# Patient Record
Sex: Male | Born: 1985 | Race: White | Hispanic: Yes | Marital: Married | Smoking: Never smoker
Health system: Southern US, Community
[De-identification: ages and names within clinical notes are randomized; demographics above are authoritative.]

## PROBLEM LIST (undated history)

## (undated) DIAGNOSIS — R51 Headache: Secondary | ICD-10-CM

## (undated) DIAGNOSIS — J189 Pneumonia, unspecified organism: Secondary | ICD-10-CM

## (undated) HISTORY — PX: HERNIA REPAIR: SHX51

---
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
## 2008-04-03 ENCOUNTER — Emergency Department (HOSPITAL_COMMUNITY): Admission: EM | Admit: 2008-04-03 | Discharge: 2008-04-03 | Payer: Self-pay | Admitting: Emergency Medicine

## 2008-05-28 ENCOUNTER — Emergency Department (HOSPITAL_COMMUNITY): Admission: EM | Admit: 2008-05-28 | Discharge: 2008-05-28 | Payer: Self-pay | Admitting: Emergency Medicine

## 2011-03-28 ENCOUNTER — Encounter (INDEPENDENT_AMBULATORY_CARE_PROVIDER_SITE_OTHER): Payer: Self-pay | Admitting: General Surgery

## 2011-03-28 ENCOUNTER — Ambulatory Visit (INDEPENDENT_AMBULATORY_CARE_PROVIDER_SITE_OTHER): Payer: Worker's Compensation | Admitting: General Surgery

## 2011-03-28 VITALS — BP 102/60 | HR 60 | Temp 98.6°F | Resp 16 | Ht 68.0 in | Wt 150.1 lb

## 2011-03-28 DIAGNOSIS — K429 Umbilical hernia without obstruction or gangrene: Secondary | ICD-10-CM

## 2011-03-28 NOTE — Progress Notes (Signed)
Patient ID: Travis Walker, male   DOB: 09/18/1985, 25 y.o.   MRN: [id number]  Chief Complaint  Patient presents with  . New Evaluation    eval of umbilical hernia    HPI Travis Walker is a 25 y.o. male.   HPI 25 year old Hispanic male referred by his primary care physician Dr Lauensein for evaluation of an umbilical hernia. The patient states that he started noticing pain around his umbilical area on November 25. The patient states that it bothersome primarily when he bends over or while he is at work. The discomfort lasts for several hours. He describes it as a pinch. He denies any nausea, vomiting, diarrhea, constipation, melena, hematochezia, weight loss, fever, or chills. He works as a roofer.    History reviewed. No pertinent past medical history.  History reviewed. No pertinent past surgical history.  History reviewed. No pertinent family history.  Social History History  Substance Use Topics  . Smoking status: Never Smoker   . Smokeless tobacco: Never Used  . Alcohol Use: Yes    No Known Allergies  No current outpatient prescriptions on file.    Review of Systems Review of Systems  Constitutional: Negative for fever, chills, appetite change and unexpected weight change.  HENT: Negative for congestion and trouble swallowing.   Eyes: Negative for visual disturbance.  Respiratory: Negative for chest tightness and shortness of breath.   Cardiovascular: Negative for chest pain and leg swelling.       No PND, no orthopnea, no DOE  Gastrointestinal:       See HPI  Genitourinary: Negative for dysuria and hematuria.  Musculoskeletal: Negative.   Skin: Negative for rash.  Neurological: Negative for seizures and speech difficulty.  Hematological: Does not bruise/bleed easily.  Psychiatric/Behavioral: Negative for behavioral problems and confusion.    Blood pressure 102/60, pulse 60, temperature 98.6 F (37 C), temperature source Temporal, resp. rate 16,  height 5' 8" (1.727 m), weight 150 lb 2 oz (68.096 kg).  Physical Exam Physical Exam  Constitutional: He is oriented to person, place, and time. He appears well-developed and well-nourished. No distress.  HENT:  Head: Normocephalic and atraumatic.  Eyes: Conjunctivae are normal. No scleral icterus.  Neck: Normal range of motion. Neck supple. No tracheal deviation present.  Cardiovascular: Normal rate, regular rhythm and normal heart sounds.   Pulmonary/Chest: Effort normal and breath sounds normal. No respiratory distress. He has no wheezes.  Abdominal: Soft. Bowel sounds are normal. He exhibits no distension. There is tenderness (some TTP around umbilicus). There is no rebound and no guarding. A hernia is present.       Small dime sized umbilical hernia  Musculoskeletal: Normal range of motion. He exhibits no edema and no tenderness.  Lymphadenopathy:    He has no cervical adenopathy.  Neurological: He is alert and oriented to person, place, and time.  Skin: Skin is warm and dry.  Psychiatric: He has a normal mood and affect. His behavior is normal. Thought content normal.    Data Reviewed Referring provider's note  Assessment    Umbilical hernia    Plan    We discussed the etiology of umbilical l hernias. We discussed the signs & symptoms of incarceration & strangulation.  We discussed non-operative and operative management. His hernia is fairly small so I doubt he will need mesh  The patient has elected to proceed with open repair of umbilical hernia with possible mesh    I described the procedure in detail.    The patient was given educational material. We discussed the risks and benefits including but not limited to bleeding, infection, chronic pain, nerve entrapment, hernia recurrence, possible mesh complications, hematoma formation, urinary retention, blood clots, injury to the surrounding structures, and anesthesia risk. We also discussed the typical post operative recovery  course, including no heavy lifting for 4-6 weeks. I explained that the likelihood of improvement of their symptoms is good.  Vega Stare M. Ashe Graybeal, MD, FACS General, Bariatric, & Minimally Invasive Surgery [facility]         Jatoria Kneeland M 03/28/2011, 9:36 AM    

## 2011-03-28 NOTE — Patient Instructions (Signed)
Review the pamphlet I gave you

## 2011-04-02 ENCOUNTER — Encounter (HOSPITAL_COMMUNITY): Payer: Self-pay

## 2011-04-11 ENCOUNTER — Encounter (HOSPITAL_COMMUNITY)
Admission: RE | Admit: 2011-04-11 | Discharge: 2011-04-11 | Disposition: A | Discharge status: Home / Self Care | Payer: Worker's Compensation | Source: Ambulatory Visit | Attending: General Surgery | Admitting: General Surgery

## 2011-04-11 ENCOUNTER — Encounter (HOSPITAL_COMMUNITY): Payer: Self-pay

## 2011-04-11 HISTORY — DX: Pneumonia, unspecified organism: J18.9

## 2011-04-11 HISTORY — DX: Headache: R51

## 2011-04-11 LAB — CBC
HCT: 44.3 % (ref 39.0–52.0)
MCHC: 33.9 g/dL (ref 30.0–36.0)
MCV: 90.8 fL (ref 78.0–100.0)
RDW: 13.1 % (ref 11.5–15.5)

## 2011-04-11 LAB — DIFFERENTIAL
Basophils Absolute: 0 10*3/uL (ref 0.0–0.1)
Basophils Relative: 1 % (ref 0–1)
Eosinophils Relative: 5 % (ref 0–5)
Monocytes Absolute: 0.3 10*3/uL (ref 0.1–1.0)

## 2011-04-11 NOTE — Pre-Procedure Instructions (Signed)
20 Demarian B Leverich  04/11/2011   Your procedure is scheduled on:  Thursday April 16, 2010  Report to [facility] Short Stay Center at 7:00am AM.  Call this number if you have problems the morning of surgery: [phone]   Remember:   Do not eat food:After Midnight.  May have clear liquids: up to 4 Hours before arrival. (up to 3:00am)  Clear liquids include soda, tea, black coffee, apple or grape juice, broth.  Take these medicines the morning of surgery with A SIP OF WATER: none   Do not wear jewelry, make-up or nail polish.  Do not wear lotions, powders, or perfumes. You may wear deodorant.  Do not shave 48 hours prior to surgery.  Do not bring valuables to the hospital.  Contacts, dentures or bridgework may not be worn into surgery.  Leave suitcase in the car. After surgery it may be brought to your room.  For patients admitted to the hospital, checkout time is 11:00 AM the day of discharge.   Patients discharged the day of surgery will not be allowed to drive home.  Name and phone number of your driver: April Casalino [phone]  Special Instructions: CHG Shower Use Special Wash: 1/2 bottle night before surgery and 1/2 bottle morning of surgery.   Please read over the following fact sheets that you were given: Pain Booklet, Coughing and Deep Breathing, MRSA Information and Surgical Site Infection Prevention

## 2011-04-16 MED ORDER — CEFAZOLIN SODIUM 1-5 GM-% IV SOLN
1.0000 g | INTRAVENOUS | Status: DC
  Filled 2011-04-16: qty 50

## 2011-04-17 ENCOUNTER — Ambulatory Visit (HOSPITAL_COMMUNITY): Payer: Worker's Compensation | Admitting: Anesthesiology

## 2011-04-17 ENCOUNTER — Encounter (HOSPITAL_COMMUNITY): Payer: Self-pay | Admitting: *Deleted

## 2011-04-17 ENCOUNTER — Encounter (HOSPITAL_COMMUNITY)
Admission: RE | Disposition: A | Discharge status: Home / Self Care | Payer: Self-pay | Source: Ambulatory Visit | Attending: General Surgery

## 2011-04-17 ENCOUNTER — Encounter (HOSPITAL_COMMUNITY): Payer: Self-pay | Admitting: Anesthesiology

## 2011-04-17 ENCOUNTER — Ambulatory Visit (HOSPITAL_COMMUNITY)
Admission: RE | Admit: 2011-04-17 | Discharge: 2011-04-17 | Disposition: A | Discharge status: Home / Self Care | Payer: Worker's Compensation | Source: Ambulatory Visit | Attending: General Surgery | Admitting: General Surgery

## 2011-04-17 DIAGNOSIS — Z01812 Encounter for preprocedural laboratory examination: Secondary | ICD-10-CM | POA: Insufficient documentation

## 2011-04-17 DIAGNOSIS — K429 Umbilical hernia without obstruction or gangrene: Secondary | ICD-10-CM

## 2011-04-17 HISTORY — PX: UMBILICAL HERNIA REPAIR: SHX196

## 2011-04-17 SURGERY — REPAIR, HERNIA, UMBILICAL, ADULT
Anesthesia: General | Site: Abdomen | Wound class: Clean

## 2011-04-17 MED ORDER — MEPERIDINE HCL 25 MG/ML IJ SOLN: 6.2500 mg | INTRAMUSCULAR | Status: DC | PRN

## 2011-04-17 MED ORDER — ONDANSETRON HCL 4 MG/2ML IJ SOLN
INTRAMUSCULAR | Status: DC | PRN
  Administered 2011-04-17: 4 mg via INTRAVENOUS

## 2011-04-17 MED ORDER — ONDANSETRON HCL 4 MG/2ML IJ SOLN: 4.0000 mg | Freq: Once | INTRAMUSCULAR | Status: DC | PRN

## 2011-04-17 MED ORDER — CEFAZOLIN SODIUM 1-5 GM-% IV SOLN
INTRAVENOUS | Status: DC | PRN
  Administered 2011-04-17: 1 g via INTRAVENOUS

## 2011-04-17 MED ORDER — MORPHINE SULFATE 2 MG/ML IJ SOLN: 0.0500 mg/kg | INTRAMUSCULAR | Status: DC | PRN

## 2011-04-17 MED ORDER — PROPOFOL 10 MG/ML IV EMUL
INTRAVENOUS | Status: DC | PRN
  Administered 2011-04-17: 200 mg via INTRAVENOUS
  Administered 2011-04-17: 50 mg via INTRAVENOUS

## 2011-04-17 MED ORDER — OXYCODONE-ACETAMINOPHEN 5-325 MG PO TABS: 1.0000 | ORAL_TABLET | ORAL | Status: AC | PRN

## 2011-04-17 MED ORDER — HYDROMORPHONE HCL PF 1 MG/ML IJ SOLN
0.2500 mg | INTRAMUSCULAR | Status: DC | PRN
Start: 2011-04-17 — End: 2011-04-17
  Administered 2011-04-17 (×3): 0.5 mg via INTRAVENOUS

## 2011-04-17 MED ORDER — LACTATED RINGERS IV SOLN
INTRAVENOUS | Status: DC
  Administered 2011-04-17: 09:00:00 via INTRAVENOUS

## 2011-04-17 MED ORDER — MIDAZOLAM HCL 5 MG/5ML IJ SOLN
INTRAMUSCULAR | Status: DC | PRN
  Administered 2011-04-17 (×2): 1 mg via INTRAVENOUS

## 2011-04-17 MED ORDER — LACTATED RINGERS IV SOLN
INTRAVENOUS | Status: DC | PRN
  Administered 2011-04-17 (×2): via INTRAVENOUS

## 2011-04-17 MED ORDER — 0.9 % SODIUM CHLORIDE (POUR BTL) OPTIME
TOPICAL | Status: DC | PRN
  Administered 2011-04-17: 1000 mL

## 2011-04-17 MED ORDER — HYDROMORPHONE HCL PF 1 MG/ML IJ SOLN
INTRAMUSCULAR | Status: AC
  Filled 2011-04-17: qty 1

## 2011-04-17 MED ORDER — BUPIVACAINE-EPINEPHRINE 0.25% -1:200000 IJ SOLN
INTRAMUSCULAR | Status: DC | PRN
  Administered 2011-04-17: 30 mL

## 2011-04-17 MED ORDER — SUFENTANIL CITRATE 50 MCG/ML IV SOLN
INTRAVENOUS | Status: DC | PRN
  Administered 2011-04-17: 5 ug via INTRAVENOUS
  Administered 2011-04-17: 10 ug via INTRAVENOUS
  Administered 2011-04-17: 5 ug via INTRAVENOUS

## 2011-04-17 SURGICAL SUPPLY — 50 items
BENZOIN TINCTURE PRP APPL 2/3 (GAUZE/BANDAGES/DRESSINGS) ×2 IMPLANT
BLADE SURG 10 STRL SS (BLADE) ×2 IMPLANT
BLADE SURG 15 STRL LF DISP TIS (BLADE) ×1 IMPLANT
BLADE SURG 15 STRL SS (BLADE) ×1
BLADE SURG ROTATE 9660 (MISCELLANEOUS) IMPLANT
CANISTER SUCTION 2500CC (MISCELLANEOUS) ×2 IMPLANT
CHLORAPREP W/TINT 26ML (MISCELLANEOUS) ×2 IMPLANT
CLOTH BEACON ORANGE TIMEOUT ST (SAFETY) ×2 IMPLANT
COVER SURGICAL LIGHT HANDLE (MISCELLANEOUS) ×2 IMPLANT
DECANTER SPIKE VIAL GLASS SM (MISCELLANEOUS) IMPLANT
DERMABOND ADHESIVE PROPEN (GAUZE/BANDAGES/DRESSINGS) ×1
DERMABOND ADVANCED (GAUZE/BANDAGES/DRESSINGS)
DERMABOND ADVANCED .7 DNX12 (GAUZE/BANDAGES/DRESSINGS) IMPLANT
DERMABOND ADVANCED .7 DNX6 (GAUZE/BANDAGES/DRESSINGS) ×1 IMPLANT
DRAPE LAPAROSCOPIC ABDOMINAL (DRAPES) ×2 IMPLANT
DRAPE UTILITY 15X26 W/TAPE STR (DRAPE) ×4 IMPLANT
DRSG TEGADERM 4X4.75 (GAUZE/BANDAGES/DRESSINGS) ×2 IMPLANT
ELECT CAUTERY BLADE 6.4 (BLADE) ×2 IMPLANT
ELECT REM PT RETURN 9FT ADLT (ELECTROSURGICAL) ×2
ELECTRODE REM PT RTRN 9FT ADLT (ELECTROSURGICAL) ×1 IMPLANT
GLOVE BIO SURGEON STRL SZ7.5 (GLOVE) ×2 IMPLANT
GLOVE BIOGEL PI IND STRL 7.5 (GLOVE) ×2 IMPLANT
GLOVE BIOGEL PI IND STRL 8 (GLOVE) ×1 IMPLANT
GLOVE BIOGEL PI INDICATOR 7.5 (GLOVE) ×2
GLOVE BIOGEL PI INDICATOR 8 (GLOVE) ×1
GLOVE ECLIPSE 7.5 STRL STRAW (GLOVE) ×2 IMPLANT
GLOVE SS BIOGEL STRL SZ 6.5 (GLOVE) ×1 IMPLANT
GLOVE SUPERSENSE BIOGEL SZ 6.5 (GLOVE) ×1
GOWN STRL NON-REIN LRG LVL3 (GOWN DISPOSABLE) ×2 IMPLANT
GOWN STRL REIN XL XLG (GOWN DISPOSABLE) ×2 IMPLANT
KIT BASIN OR (CUSTOM PROCEDURE TRAY) ×2 IMPLANT
KIT ROOM TURNOVER OR (KITS) ×2 IMPLANT
NEEDLE HYPO 25GX1X1/2 BEV (NEEDLE) ×2 IMPLANT
NS IRRIG 1000ML POUR BTL (IV SOLUTION) ×2 IMPLANT
PACK SURGICAL SETUP 50X90 (CUSTOM PROCEDURE TRAY) ×2 IMPLANT
PAD ARMBOARD 7.5X6 YLW CONV (MISCELLANEOUS) ×4 IMPLANT
PENCIL BUTTON HOLSTER BLD 10FT (ELECTRODE) ×2 IMPLANT
SPONGE GAUZE 4X4 12PLY (GAUZE/BANDAGES/DRESSINGS) ×2 IMPLANT
STRIP CLOSURE SKIN 1/4X4 (GAUZE/BANDAGES/DRESSINGS) ×2 IMPLANT
SUT MNCRL AB 4-0 PS2 18 (SUTURE) ×4 IMPLANT
SUT NOVA NAB DX-16 0-1 5-0 T12 (SUTURE) ×2 IMPLANT
SUT VIC AB 3-0 SH 18 (SUTURE) ×2 IMPLANT
SYR BULB 3OZ (MISCELLANEOUS) ×2 IMPLANT
SYR CONTROL 10ML LL (SYRINGE) ×2 IMPLANT
TOWEL OR 17X24 6PK STRL BLUE (TOWEL DISPOSABLE) ×2 IMPLANT
TOWEL OR 17X26 10 PK STRL BLUE (TOWEL DISPOSABLE) ×2 IMPLANT
TOWEL OR NON WOVEN STRL DISP B (DISPOSABLE) ×2 IMPLANT
TUBE CONNECTING 12X1/4 (SUCTIONS) IMPLANT
WATER STERILE IRR 1000ML POUR (IV SOLUTION) IMPLANT
YANKAUER SUCT BULB TIP NO VENT (SUCTIONS) IMPLANT

## 2011-04-17 NOTE — Preoperative (Signed)
Beta Blockers   Reason not to administer Beta Blockers:Not Applicable 

## 2011-04-17 NOTE — H&P (View-Only) (Signed)
Patient ID: Travis Walker, male   DOB: 1986/03/30, 27 y.o.   MRN: [id number]  Chief Complaint  Patient presents with  . New Evaluation    eval of umbilical hernia    HPI Travis Walker is a 26 y.o. male.   HPI 26 year old Hispanic male referred by his primary care physician Dr Deatra Robinson for evaluation of an umbilical hernia. The patient states that he started noticing pain around his umbilical area on November 25. The patient states that it bothersome primarily when he bends over or while he is at work. The discomfort lasts for several hours. He describes it as a pinch. He denies any nausea, vomiting, diarrhea, constipation, melena, hematochezia, weight loss, fever, or chills. He works as a Designer, fashion/clothing.    History reviewed. No pertinent past medical history.  History reviewed. No pertinent past surgical history.  History reviewed. No pertinent family history.  Social History History  Substance Use Topics  . Smoking status: Never Smoker   . Smokeless tobacco: Never Used  . Alcohol Use: Yes    No Known Allergies  No current outpatient prescriptions on file.    Review of Systems Review of Systems  Constitutional: Negative for fever, chills, appetite change and unexpected weight change.  HENT: Negative for congestion and trouble swallowing.   Eyes: Negative for visual disturbance.  Respiratory: Negative for chest tightness and shortness of breath.   Cardiovascular: Negative for chest pain and leg swelling.       No PND, no orthopnea, no DOE  Gastrointestinal:       See HPI  Genitourinary: Negative for dysuria and hematuria.  Musculoskeletal: Negative.   Skin: Negative for rash.  Neurological: Negative for seizures and speech difficulty.  Hematological: Does not bruise/bleed easily.  Psychiatric/Behavioral: Negative for behavioral problems and confusion.    Blood pressure 102/60, pulse 60, temperature 98.6 F (37 C), temperature source Temporal, resp. rate 16,  height 5\' 8"  (1.727 m), weight 150 lb 2 oz (68.096 kg).  Physical Exam Physical Exam  Constitutional: He is oriented to person, place, and time. He appears well-developed and well-nourished. No distress.  HENT:  Head: Normocephalic and atraumatic.  Eyes: Conjunctivae are normal. No scleral icterus.  Neck: Normal range of motion. Neck supple. No tracheal deviation present.  Cardiovascular: Normal rate, regular rhythm and normal heart sounds.   Pulmonary/Chest: Effort normal and breath sounds normal. No respiratory distress. He has no wheezes.  Abdominal: Soft. Bowel sounds are normal. He exhibits no distension. There is tenderness (some TTP around umbilicus). There is no rebound and no guarding. A hernia is present.       Small dime sized umbilical hernia  Musculoskeletal: Normal range of motion. He exhibits no edema and no tenderness.  Lymphadenopathy:    He has no cervical adenopathy.  Neurological: He is alert and oriented to person, place, and time.  Skin: Skin is warm and dry.  Psychiatric: He has a normal mood and affect. His behavior is normal. Thought content normal.    Data Reviewed Referring provider's note  Assessment    Umbilical hernia    Plan    We discussed the etiology of umbilical l hernias. We discussed the signs & symptoms of incarceration & strangulation.  We discussed non-operative and operative management. His hernia is fairly small so I doubt he will need mesh  The patient has elected to proceed with open repair of umbilical hernia with possible mesh    I described the procedure in detail.  The patient was given educational material. We discussed the risks and benefits including but not limited to bleeding, infection, chronic pain, nerve entrapment, hernia recurrence, possible mesh complications, hematoma formation, urinary retention, blood clots, injury to the surrounding structures, and anesthesia risk. We also discussed the typical post operative recovery  course, including no heavy lifting for 4-6 weeks. I explained that the likelihood of improvement of their symptoms is good.  Mary Sella. Andrey Campanile, MD, FACS General, Bariatric, & Minimally Invasive Surgery [facility], [place]         [facility] M 03/28/2011, 9:36 AM

## 2011-04-17 NOTE — Anesthesia Postprocedure Evaluation (Signed)
  Anesthesia Post-op Note  Patient: Travis Walker  Procedure(s) Performed:  HERNIA REPAIR UMBILICAL ADULT  Patient Location: PACU  Anesthesia Type: General  Level of Consciousness: awake, alert  and oriented  Airway and Oxygen Therapy: Patient Spontanous Breathing  Post-op Pain: mild  Post-op Assessment: Post-op Vital signs reviewed, Patient's Cardiovascular Status Stable, Respiratory Function Stable, Patent Airway, No signs of Nausea or vomiting and Pain level controlled  Post-op Vital Signs: Reviewed and stable  Complications: No apparent anesthesia complications

## 2011-04-17 NOTE — Anesthesia Preprocedure Evaluation (Addendum)
Anesthesia Evaluation  Patient identified by MRN, date of birth, ID band Patient awake    Reviewed: Allergy & Precautions, H&P , NPO status , Patient's Chart, lab work & pertinent test results, reviewed documented beta blocker date and time   Airway Mallampati: I TM Distance: >3 FB Neck ROM: Full    Dental No notable dental hx. (+) Dental Advisory Given   Pulmonary  clear to auscultation  Pulmonary exam normal       Cardiovascular Regular Normal    Neuro/Psych    GI/Hepatic   Endo/Other    Renal/GU      Musculoskeletal   Abdominal   Peds  Hematology   Anesthesia Other Findings   Reproductive/Obstetrics                        Anesthesia Physical Anesthesia Plan  ASA: I  Anesthesia Plan: General   Post-op Pain Management:    Induction:   Airway Management Planned: Oral ETT  Additional Equipment:   Intra-op Plan:   Post-operative Plan: Extubation in OR  Informed Consent: I have reviewed the patients History and Physical, chart, labs and discussed the procedure including the risks, benefits and alternatives for the proposed anesthesia with the patient or authorized representative who has indicated his/her understanding and acceptance.   Dental advisory given  Plan Discussed with: CRNA, Anesthesiologist and Surgeon  Anesthesia Plan Comments:         Anesthesia Quick Evaluation

## 2011-04-17 NOTE — Op Note (Signed)
Umbilical Herniorrhaphy Procedure Note  Indications: Symptomatic umbilical hernia  Pre-operative Diagnosis: umbilical hernia  Post-operative Diagnosis: umbilical hernia  Surgeon: Mary Sella. Andrey Campanile, MD, FACS   Assistants: none  Anesthesia: General endotracheal anesthesia and Local anesthesia 0.25.% bupivacaine, with epinephrine  ASA Class: 1  Procedure Details  The patient was seen in the Holding Room. The risks, benefits, complications, treatment options, and expected outcomes were discussed with the patient. The possibilities of reaction to medication, pulmonary aspiration, perforation of viscus, bleeding, recurrent infection, hernia recurrence, blood clot formation, hematoma/seroma formation, the need for additional procedures, failure to diagnose a condition, and creating a complication requiring transfusion or operation were discussed with the patient. The patient concurred with the proposed plan, giving informed consent.  The site of surgery properly noted/marked. The patient was taken to Operating Room # 16, identified as Travis Walker and the procedure verified as Umbilical Herniorrhaphy. A Time Out was held and the above information confirmed.  The patient was placed supine.  After establishing general anesthesia, the abdomen was prepped and draped in standard fashion.  Local was infiltrated infraumbilically.  A infraumbilical transverse incision was created.  Dissection was carried down to the hernia sac located above the fascia and was mobilized from surrounding structures.  Intact fascia was identified circumferentially around the defect.  The defect was about 1 cm. This was closed with 4 interrupted 0 novafil sutures.  The soft tissue was irrigated and closed in layers.  Hemostasis was confirmed.  The umbilicus was tacked backed down to the fascia with 2 interrupted 3-0 vicryl sutures. The skin incision was closed in 2 layers with a 3-0 Vicryl in the deep dermis followed by a 4-0  Monocryl subcuticular closure.  Steri-Strips, gauze, and a tegaderm were applied at the end of the operation.  Instrument, sponge, and needle counts were correct prior to closure and at the conclusion of the case.   Findings: <1cm fascial defect  Estimated Blood Loss:  Minimal         Drains: none        Specimens: none         Implants: none         Complications:  None; patient tolerated the procedure well.         Disposition: PACU - hemodynamically stable.         Condition: stable  Attending Attestation: I performed the procedure.   Mary Sella. Andrey Campanile, MD, FACS General, Bariatric, & Minimally Invasive Surgery [facility], [place]

## 2011-04-17 NOTE — Transfer of Care (Signed)
Immediate Anesthesia Transfer of Care Note  Patient: Travis Walker  Procedure(s) Performed:  HERNIA REPAIR UMBILICAL ADULT  Patient Location: PACU  Anesthesia Type: General  Level of Consciousness: awake, alert  and patient cooperative  Airway & Oxygen Therapy: Patient Spontanous Breathing and Patient connected to face mask oxygen  Post-op Assessment: Report given to PACU RN  Post vital signs: Reviewed and stable  Complications: No apparent anesthesia complications

## 2011-04-17 NOTE — Anesthesia Procedure Notes (Signed)
Procedure Name: LMA Insertion Date/Time: 04/17/2011 9:41 AM Performed by: Glendora Score Pre-anesthesia Checklist: Patient identified, Emergency Drugs available, Suction available and Patient being monitored Patient Re-evaluated:Patient Re-evaluated prior to inductionOxygen Delivery Method: Circle System Utilized Preoxygenation: Pre-oxygenation with 100% oxygen Intubation Type: IV induction LMA: LMA with gastric port inserted LMA Size: 5.0 Number of attempts: 1 Placement Confirmation: positive ETCO2 and breath sounds checked- equal and bilateral Tube secured with: Tape

## 2011-04-17 NOTE — Interval H&P Note (Signed)
History and Physical Interval Note:  04/17/2011 8:55 AM  Travis Walker  has presented today for surgery, with the diagnosis of Umbilical hernia.  The various methods of treatment have been discussed with the patient and family. After consideration of risks, benefits and other options for treatment, the patient has consented to  Procedure(s): HERNIA REPAIR UMBILICAL ADULT as a surgical intervention .  The patients' history has been reviewed, patient examined, no change in status, stable for surgery.  I have reviewed the patients' chart and labs.  Questions were answered to the patient's satisfaction.    Mary Sella. Andrey Campanile, MD, FACS General, Bariatric, & Minimally Invasive Surgery [facility], [place]   [facility] M

## 2011-04-18 ENCOUNTER — Encounter (HOSPITAL_COMMUNITY): Payer: Self-pay | Admitting: General Surgery

## 2011-04-25 ENCOUNTER — Ambulatory Visit (INDEPENDENT_AMBULATORY_CARE_PROVIDER_SITE_OTHER): Payer: Worker's Compensation | Admitting: General Surgery

## 2011-04-25 VITALS — BP 110/80 | HR 68 | Temp 97.3°F | Resp 12 | Ht 68.0 in | Wt 153.0 lb

## 2011-04-25 DIAGNOSIS — T8140XA Infection following a procedure, unspecified, initial encounter: Secondary | ICD-10-CM

## 2011-04-25 DIAGNOSIS — IMO0002 Reserved for concepts with insufficient information to code with codable children: Secondary | ICD-10-CM | POA: Insufficient documentation

## 2011-04-25 MED ORDER — CEPHALEXIN 250 MG PO CAPS: 250.0000 mg | ORAL_CAPSULE | Freq: Four times a day (QID) | ORAL | Status: DC

## 2011-04-25 NOTE — Progress Notes (Signed)
Addended byLiliana Cline on: 04/25/2011 03:59 PM   Modules accepted: Orders

## 2011-04-25 NOTE — Progress Notes (Signed)
HPI The patient comes in with foul-smelling drainage and swelling around his periumbilical incision.  PE On examination he has a puffiness around his umbilicus and in the central portion of the belly button there is some cloudy serous fluid which we drained. Approximately 20 cc were drained. This did bring some relief to the patient. A culture was taken.  Studiy review Likely cultures were sent  Assessment Likely infected periumbilical incision. It seems like it is infected seroma. Cultures have been sent.  Plan Place a patient on oral antibiotics, likely Keflex. Having come back to see me on Tuesday of next week. Keep incision clean and dry and place antibiotic ointment deep in the umbilicus.

## 2011-04-28 LAB — WOUND CULTURE

## 2011-04-29 ENCOUNTER — Ambulatory Visit (INDEPENDENT_AMBULATORY_CARE_PROVIDER_SITE_OTHER): Payer: Worker's Compensation | Admitting: General Surgery

## 2011-04-29 ENCOUNTER — Encounter (INDEPENDENT_AMBULATORY_CARE_PROVIDER_SITE_OTHER): Payer: Self-pay | Admitting: General Surgery

## 2011-04-29 ENCOUNTER — Other Ambulatory Visit (INDEPENDENT_AMBULATORY_CARE_PROVIDER_SITE_OTHER): Payer: Self-pay

## 2011-04-29 ENCOUNTER — Ambulatory Visit (HOSPITAL_COMMUNITY)
Admission: RE | Admit: 2011-04-29 | Discharge: 2011-04-29 | Disposition: A | Discharge status: Home / Self Care | Payer: Worker's Compensation | Source: Ambulatory Visit | Attending: General Surgery | Admitting: General Surgery

## 2011-04-29 DIAGNOSIS — T148XXA Other injury of unspecified body region, initial encounter: Secondary | ICD-10-CM

## 2011-04-29 DIAGNOSIS — T8149XA Infection following a procedure, other surgical site, initial encounter: Secondary | ICD-10-CM

## 2011-04-29 DIAGNOSIS — K651 Peritoneal abscess: Secondary | ICD-10-CM | POA: Insufficient documentation

## 2011-04-29 DIAGNOSIS — T8140XA Infection following a procedure, unspecified, initial encounter: Secondary | ICD-10-CM

## 2011-04-29 MED ORDER — IOHEXOL 300 MG/ML  SOLN
100.0000 mL | Freq: Once | INTRAMUSCULAR | Status: AC | PRN
  Administered 2011-04-29: 100 mL via INTRAVENOUS

## 2011-04-29 NOTE — Progress Notes (Signed)
HPI The patient comes in with continued pain and drainage from his umbilicus above the incision line from his above the umbilical hernia repair  PE On examination he has some cloudy serous fluid coming directly from the umbilicus. The incision itself is puffy however it is not draining  Studiy review Currently there are no studies to review.  Assessment Peritoneal leakage and wound infection of an umbilical hernia repair.  Plan I was in the patient on hospital for stat CT scan of the abdomen and pelvis to look at the site of his umbilical hernia repair and then have him seen by the surgical service are for consideration of drainage and treatment.

## 2011-05-05 ENCOUNTER — Encounter (HOSPITAL_COMMUNITY): Payer: Self-pay | Admitting: Pharmacy Technician

## 2011-05-05 ENCOUNTER — Encounter (INDEPENDENT_AMBULATORY_CARE_PROVIDER_SITE_OTHER): Payer: Self-pay | Admitting: General Surgery

## 2011-05-05 ENCOUNTER — Encounter (HOSPITAL_COMMUNITY)
Admission: RE | Admit: 2011-05-05 | Discharge: 2011-05-05 | Disposition: A | Discharge status: Home / Self Care | Payer: Worker's Compensation | Source: Ambulatory Visit | Attending: General Surgery | Admitting: General Surgery

## 2011-05-05 ENCOUNTER — Ambulatory Visit (INDEPENDENT_AMBULATORY_CARE_PROVIDER_SITE_OTHER): Payer: Worker's Compensation | Admitting: General Surgery

## 2011-05-05 ENCOUNTER — Encounter (HOSPITAL_COMMUNITY): Payer: Self-pay

## 2011-05-05 VITALS — BP 122/76 | HR 68 | Temp 98.2°F | Resp 16 | Ht 68.0 in | Wt 150.0 lb

## 2011-05-05 DIAGNOSIS — IMO0002 Reserved for concepts with insufficient information to code with codable children: Secondary | ICD-10-CM

## 2011-05-05 LAB — CBC
HCT: 43.4 % (ref 39.0–52.0)
Hemoglobin: 15.2 g/dL (ref 13.0–17.0)
MCH: 30.7 pg (ref 26.0–34.0)
MCHC: 35 g/dL (ref 30.0–36.0)
MCV: 87.7 fL (ref 78.0–100.0)
Platelets: 146 K/uL — ABNORMAL LOW (ref 150–400)
RBC: 4.95 MIL/uL (ref 4.22–5.81)
RDW: 12.6 % (ref 11.5–15.5)
WBC: 5.5 K/uL (ref 4.0–10.5)

## 2011-05-05 LAB — DIFFERENTIAL
Basophils Absolute: 0.1 10*3/uL (ref 0.0–0.1)
Eosinophils Relative: 3 % (ref 0–5)
Lymphocytes Relative: 36 % (ref 12–46)
Lymphs Abs: 2 10*3/uL (ref 0.7–4.0)
Neutro Abs: 2.9 10*3/uL (ref 1.7–7.7)
Neutrophils Relative %: 53 % (ref 43–77)

## 2011-05-05 LAB — SURGICAL PCR SCREEN
MRSA, PCR: NEGATIVE
Staphylococcus aureus: NEGATIVE

## 2011-05-05 NOTE — Patient Instructions (Signed)
20 Mister B Selsor  05/05/2011   Your procedure is scheduled on:  05/06/11    Tuesday  Surgery [id number]  Report to [facility] Stay Center at 1115 AM.  Call this number if you have problems the morning of surgery: [phone]   Remember:   Do not eat food:After Midnight. tonight  May have clear liquids:until Midnight .tonight  Clear liquids include soda, tea, black coffee, apple or grape juice, broth.  Take these medicines the morning of surgery with A SIP OF WATER:  Tylenol with sip water if needed  Do not wear jewelry, make-up or nail polish.  Do not wear lotions, powders, or perfumes. You may wear deodorant.  Do not shave 48 hours prior to surgery.  Do not bring valuables to the hospital.  Contacts, dentures or bridgework may not be worn into surgery.  Leave suitcase in the car. After surgery it may be brought to your room.  For patients admitted to the hospital, checkout time is 11:00 AM the day of discharge.   Patients discharged the day of surgery will not be allowed to drive home.  Name and phone number of your driver:April   wife Special Instructions: CHG Shower Use Special Wash: 1/2 bottle night before surgery and 1/2 bottle morning of surgery.   Please read over the following fact sheets that you were given: MRSA Information

## 2011-05-05 NOTE — Progress Notes (Signed)
Chief complaint: It is still draining  Procedure: Status post primary umbilical hernia repair  History of Present Ilness: 26-year-old Hispanic male comes in for another postoperative appointment. He initially saw one of my partners on January 11 complaining of pain and drainage from around his incision. He was actually draining from the base of his umbilicus and not from this infraumbilical incision. He underwent needle aspiration of the seroma and the fluid was sent for culture. He returned to the office several days later complaining of ongoing pain and drainage. He already had been placed on antibiotics. Because of the ongoing drainage, he was sent to the hospital for a CT scan of his abdomen and pelvis. It just revealed a subcutaneous fluid collection. There was no signs of intra-abdominal process. He was sent ome from the emergency department. He comes in today complaining of ongoing drainage, pain, and nausea. He denies fevers or chills. He has been having bowel movements. The drainage is clear and red tinged. It hurts more to the right side of the incision. He states that he hasn't had much appetite. He is still taking the antibiotics.  Past medical, surgical, family history, social history reviewed and unchanged except for what is mentioned above.  Review of systems-A. comprehensive 10 point review of systems was performed. All systems are negative except for what is mentioned in the history of present illness  Physical Exam: BP 122/76  Pulse 68  Temp(Src) 98.2 F (36.8 C) (Temporal)  Resp 16  Ht 5' 8" (1.727 m)  Wt 150 lb (68.04 kg)  BMI 22.81 kg/m2  Gen: alert, NAD, non-toxic appearing Pupils: equal, no scleral icterus Pulm: Lungs clear to auscultation, symmetric chest rise CV: regular rate and rhythm Abd: soft, nondistended. Well-healed infraumbilical incision. No cellulitis. No incisional hernia. There is a palpable fluid collection superior and to the right of his incision.  There is red-tinged drainage from the base of his umbilicus. He is mildly tender in this area. No induration. Ext: no edema, no calf tenderness Skin: no rash, no jaundice  DATA REVIEWED: CT abd/pelvis Findings: Within the subcutaneous tissues at and just above the  level of the umbilicus is a mixed gas and fluid collection anterior  to the abdominal wall musculature which measures approximately 1.4  x 4.4 x 3.2 cm in AP, transverse, longitudinal dimensions. There  is adjacent stranding. The anterior abdominal wall musculature is  intact with no evidence of hernia recurrence.  The lung bases are clear. The liver, gallbladder, spleen,  pancreas, adrenal glands, kidneys, urinary bladder, osseous  structures have a normal appearance. The bowel is unremarkable  with no evidence of gross obstruction or inflammation. There is no  adenopathy, free fluid, or pneumoperitoneum within the abdomen or  pelvis.  IMPRESSION:  There is an abscess within the subcutaneous fat of the midline  abdomen at and above the level of the umbilicus. The anterior  abdominal wall musculature is intact.  Micro results   Assessment and Plan: Infected postoperative infraumbilical incision seroma  We discussed several options including repeat aspiration in the office versus returning to the operating room for incision and drainage. We discussed the pros and cons of each approach. We discussed the risk and benefits of returning to the operating room such as bleeding, continued infection, injury to surrounding structures, blood clot formation, scarring, continued pain, and the need to leave the incision open to let heal by secondary intention. We discussed the typical postoperative course.  After discussion, the patient has elected   to return to the operating room for incision and drainage of this postoperative infected seroma. We will try to get him into the operating room as soon as possible. In the interim he is to  continue his antibiotics.  Ahjanae Cassel M. Mekiyah Gladwell, MD, FACS General, Bariatric, & Minimally Invasive Surgery [facility]  

## 2011-05-06 ENCOUNTER — Ambulatory Visit (HOSPITAL_COMMUNITY): Payer: Worker's Compensation | Admitting: Anesthesiology

## 2011-05-06 ENCOUNTER — Encounter (HOSPITAL_COMMUNITY): Payer: Self-pay

## 2011-05-06 ENCOUNTER — Encounter (HOSPITAL_COMMUNITY): Payer: Self-pay | Admitting: Anesthesiology

## 2011-05-06 ENCOUNTER — Observation Stay (HOSPITAL_COMMUNITY)
Admission: RE | Admit: 2011-05-06 | Discharge: 2011-05-07 | Disposition: A | Discharge status: Home / Self Care | Payer: Worker's Compensation | Source: Ambulatory Visit | Attending: General Surgery | Admitting: General Surgery

## 2011-05-06 ENCOUNTER — Encounter (HOSPITAL_COMMUNITY)
Admission: RE | Disposition: A | Discharge status: Home / Self Care | Payer: Self-pay | Source: Ambulatory Visit | Attending: General Surgery

## 2011-05-06 DIAGNOSIS — R110 Nausea: Secondary | ICD-10-CM | POA: Insufficient documentation

## 2011-05-06 DIAGNOSIS — Z8719 Personal history of other diseases of the digestive system: Secondary | ICD-10-CM

## 2011-05-06 DIAGNOSIS — Z01812 Encounter for preprocedural laboratory examination: Secondary | ICD-10-CM | POA: Insufficient documentation

## 2011-05-06 DIAGNOSIS — R1033 Periumbilical pain: Secondary | ICD-10-CM | POA: Insufficient documentation

## 2011-05-06 DIAGNOSIS — T819XXA Unspecified complication of procedure, initial encounter: Secondary | ICD-10-CM

## 2011-05-06 DIAGNOSIS — L988 Other specified disorders of the skin and subcutaneous tissue: Principal | ICD-10-CM | POA: Insufficient documentation

## 2011-05-06 DIAGNOSIS — T8189XA Other complications of procedures, not elsewhere classified, initial encounter: Secondary | ICD-10-CM | POA: Insufficient documentation

## 2011-05-06 DIAGNOSIS — Y838 Other surgical procedures as the cause of abnormal reaction of the patient, or of later complication, without mention of misadventure at the time of the procedure: Secondary | ICD-10-CM | POA: Insufficient documentation

## 2011-05-06 SURGERY — INCISION AND DRAINAGE, ABSCESS
Anesthesia: General | Wound class: Contaminated

## 2011-05-06 MED ORDER — ACETAMINOPHEN 10 MG/ML IV SOLN
INTRAVENOUS | Status: DC | PRN
  Administered 2011-05-06: 1000 mg via INTRAVENOUS

## 2011-05-06 MED ORDER — SODIUM CHLORIDE 0.9 % IR SOLN
Status: DC | PRN
  Administered 2011-05-06: 15:00:00

## 2011-05-06 MED ORDER — BUPIVACAINE-EPINEPHRINE PF 0.25-1:200000 % IJ SOLN
INTRAMUSCULAR | Status: AC
  Filled 2011-05-06: qty 30

## 2011-05-06 MED ORDER — ENOXAPARIN SODIUM 40 MG/0.4ML ~~LOC~~ SOLN
40.0000 mg | SUBCUTANEOUS | Status: DC
  Filled 2011-05-06: qty 0.4

## 2011-05-06 MED ORDER — MIDAZOLAM HCL 5 MG/5ML IJ SOLN
INTRAMUSCULAR | Status: DC | PRN
  Administered 2011-05-06: 2 mg via INTRAVENOUS

## 2011-05-06 MED ORDER — ACETAMINOPHEN 10 MG/ML IV SOLN
INTRAVENOUS | Status: AC
  Filled 2011-05-06: qty 100

## 2011-05-06 MED ORDER — BUPIVACAINE LIPOSOME 1.3 % IJ SUSP
20.0000 mL | INTRAMUSCULAR | Status: AC
  Administered 2011-05-06: 20 mL
  Filled 2011-05-06: qty 20

## 2011-05-06 MED ORDER — PANTOPRAZOLE SODIUM 40 MG IV SOLR
40.0000 mg | Freq: Every day | INTRAVENOUS | Status: DC
  Administered 2011-05-06: 40 mg via INTRAVENOUS
  Filled 2011-05-06 (×2): qty 40

## 2011-05-06 MED ORDER — FENTANYL CITRATE 0.05 MG/ML IJ SOLN
50.0000 ug | INTRAMUSCULAR | Status: DC | PRN
  Administered 2011-05-06: 100 ug via INTRAVENOUS

## 2011-05-06 MED ORDER — FENTANYL CITRATE 0.05 MG/ML IJ SOLN
INTRAMUSCULAR | Status: AC
  Filled 2011-05-06: qty 2

## 2011-05-06 MED ORDER — INFLUENZA VIRUS VACC SPLIT PF IM SUSP
0.5000 mL | INTRAMUSCULAR | Status: AC
  Administered 2011-05-07: 0.5 mL via INTRAMUSCULAR
  Filled 2011-05-06: qty 0.5

## 2011-05-06 MED ORDER — BUPIVACAINE-EPINEPHRINE 0.25% -1:200000 IJ SOLN
INTRAMUSCULAR | Status: DC | PRN
  Administered 2011-05-06: 3 mL

## 2011-05-06 MED ORDER — LACTATED RINGERS IV SOLN: INTRAVENOUS | Status: DC

## 2011-05-06 MED ORDER — CEFAZOLIN SODIUM 1-5 GM-% IV SOLN
1.0000 g | INTRAVENOUS | Status: AC
  Administered 2011-05-06: 1 g via INTRAVENOUS

## 2011-05-06 MED ORDER — KCL IN DEXTROSE-NACL 20-5-0.45 MEQ/L-%-% IV SOLN
INTRAVENOUS | Status: DC
  Administered 2011-05-06: 17:00:00 via INTRAVENOUS
  Administered 2011-05-07: 1000 mL via INTRAVENOUS
  Filled 2011-05-06 (×5): qty 1000

## 2011-05-06 MED ORDER — FENTANYL CITRATE 0.05 MG/ML IJ SOLN: 25.0000 ug | INTRAMUSCULAR | Status: DC | PRN

## 2011-05-06 MED ORDER — LACTATED RINGERS IV SOLN
INTRAVENOUS | Status: DC
  Administered 2011-05-06: 14:00:00 via INTRAVENOUS
  Administered 2011-05-06: 1000 mL via INTRAVENOUS

## 2011-05-06 MED ORDER — OXYCODONE HCL 5 MG PO TABS: 5.0000 mg | ORAL_TABLET | ORAL | Status: DC | PRN

## 2011-05-06 MED ORDER — ONDANSETRON HCL 4 MG/2ML IJ SOLN: 4.0000 mg | Freq: Four times a day (QID) | INTRAMUSCULAR | Status: DC | PRN

## 2011-05-06 MED ORDER — ACETAMINOPHEN 10 MG/ML IV SOLN
1000.0000 mg | Freq: Four times a day (QID) | INTRAVENOUS | Status: DC
  Administered 2011-05-06 – 2011-05-07 (×2): 1000 mg via INTRAVENOUS
  Filled 2011-05-06 (×4): qty 100

## 2011-05-06 MED ORDER — CEFAZOLIN SODIUM 1-5 GM-% IV SOLN
INTRAVENOUS | Status: AC
  Filled 2011-05-06: qty 50

## 2011-05-06 MED ORDER — MORPHINE SULFATE 2 MG/ML IJ SOLN
2.0000 mg | INTRAMUSCULAR | Status: DC | PRN
  Administered 2011-05-06 – 2011-05-07 (×2): 2 mg via INTRAVENOUS
  Filled 2011-05-06 (×3): qty 1

## 2011-05-06 MED ORDER — PROMETHAZINE HCL 25 MG/ML IJ SOLN: 6.2500 mg | INTRAMUSCULAR | Status: DC | PRN

## 2011-05-06 MED ORDER — FENTANYL CITRATE 0.05 MG/ML IJ SOLN
INTRAMUSCULAR | Status: DC | PRN
  Administered 2011-05-06 (×2): 25 ug via INTRAVENOUS

## 2011-05-06 SURGICAL SUPPLY — 33 items
BANDAGE GAUZE ELAST BULKY 4 IN (GAUZE/BANDAGES/DRESSINGS) IMPLANT
BLADE SURG 15 STRL LF DISP TIS (BLADE) ×1 IMPLANT
BLADE SURG 15 STRL SS (BLADE) ×1
CANISTER SUCTION 2500CC (MISCELLANEOUS) ×2 IMPLANT
CLOTH BEACON ORANGE TIMEOUT ST (SAFETY) ×2 IMPLANT
COVER SURGICAL LIGHT HANDLE (MISCELLANEOUS) ×2 IMPLANT
DECANTER SPIKE VIAL GLASS SM (MISCELLANEOUS) ×2 IMPLANT
DRAPE LAPAROSCOPIC ABDOMINAL (DRAPES) ×2 IMPLANT
DRSG PAD ABDOMINAL 8X10 ST (GAUZE/BANDAGES/DRESSINGS) IMPLANT
ELECT CAUTERY BLADE 6.4 (BLADE) ×2 IMPLANT
ELECT REM PT RETURN 9FT ADLT (ELECTROSURGICAL) ×2
ELECTRODE REM PT RTRN 9FT ADLT (ELECTROSURGICAL) ×1 IMPLANT
GLOVE BIO SURGEON STRL SZ7.5 (GLOVE) ×2 IMPLANT
GOWN STRL NON-REIN LRG LVL3 (GOWN DISPOSABLE) ×4 IMPLANT
KIT BASIN OR (CUSTOM PROCEDURE TRAY) ×2 IMPLANT
NEEDLE HYPO 25X1 1.5 SAFETY (NEEDLE) ×4 IMPLANT
NS IRRIG 1000ML POUR BTL (IV SOLUTION) ×2 IMPLANT
PENCIL BUTTON HOLSTER BLD 10FT (ELECTRODE) ×2 IMPLANT
SPONGE GAUZE 4X4 12PLY (GAUZE/BANDAGES/DRESSINGS) ×2 IMPLANT
SPONGE LAP 18X18 X RAY DECT (DISPOSABLE) ×4 IMPLANT
STAPLER VISISTAT 35W (STAPLE) ×2 IMPLANT
SUT GUT CHROMIC 3 0 (SUTURE) ×2 IMPLANT
SUT MNCRL AB 4-0 PS2 18 (SUTURE) IMPLANT
SUT VIC AB 3-0 SH 27 (SUTURE)
SUT VIC AB 3-0 SH 27XBRD (SUTURE) IMPLANT
SWAB COLLECTION DEVICE MRSA (MISCELLANEOUS) IMPLANT
SYR BULB 3OZ (MISCELLANEOUS) IMPLANT
SYR CONTROL 10ML LL (SYRINGE) ×4 IMPLANT
TAPE CLOTH SURG 4X10 WHT LF (GAUZE/BANDAGES/DRESSINGS) ×2 IMPLANT
TOWEL OR 17X26 10 PK STRL BLUE (TOWEL DISPOSABLE) ×4 IMPLANT
TUBE ANAEROBIC SPECIMEN COL (MISCELLANEOUS) IMPLANT
WATER STERILE IRR 1000ML POUR (IV SOLUTION) IMPLANT
YANKAUER SUCT BULB TIP NO VENT (SUCTIONS) ×2 IMPLANT

## 2011-05-06 NOTE — H&P (View-Only) (Signed)
Chief complaint: It is still draining  Procedure: Status post primary umbilical hernia repair  History of Present Ilness: 26 year old Hispanic male comes in for another postoperative appointment. He initially saw one of my partners on January 11 complaining of pain and drainage from around his incision. He was actually draining from the base of his umbilicus and not from this infraumbilical incision. He underwent needle aspiration of the seroma and the fluid was sent for culture. He returned to the office several days later complaining of ongoing pain and drainage. He already had been placed on antibiotics. Because of the ongoing drainage, he was sent to the hospital for a CT scan of his abdomen and pelvis. It just revealed a subcutaneous fluid collection. There was no signs of intra-abdominal process. He was sent ome from the emergency department. He comes in today complaining of ongoing drainage, pain, and nausea. He denies fevers or chills. He has been having bowel movements. The drainage is clear and red tinged. It hurts more to the right side of the incision. He states that he hasn't had much appetite. He is still taking the antibiotics.  Past medical, surgical, family history, social history reviewed and unchanged except for what is mentioned above.  Review of systems-A. comprehensive 10 point review of systems was performed. All systems are negative except for what is mentioned in the history of present illness  Physical Exam: BP 122/76  Pulse 68  Temp(Src) 98.2 F (36.8 C) (Temporal)  Resp 16  Ht 5\' 8"  (1.727 m)  Wt 150 lb (68.04 kg)  BMI 22.81 kg/m2  Gen: alert, NAD, non-toxic appearing Pupils: equal, no scleral icterus Pulm: Lungs clear to auscultation, symmetric chest rise CV: regular rate and rhythm Abd: soft, nondistended. Well-healed infraumbilical incision. No cellulitis. No incisional hernia. There is a palpable fluid collection superior and to the right of his incision.  There is red-tinged drainage from the base of his umbilicus. He is mildly tender in this area. No induration. Ext: no edema, no calf tenderness Skin: no rash, no jaundice  DATA REVIEWED: CT abd/pelvis Findings: Within the subcutaneous tissues at and just above the  level of the umbilicus is a mixed gas and fluid collection anterior  to the abdominal wall musculature which measures approximately 1.4  x 4.4 x 3.2 cm in AP, transverse, longitudinal dimensions. There  is adjacent stranding. The anterior abdominal wall musculature is  intact with no evidence of hernia recurrence.  The lung bases are clear. The liver, gallbladder, spleen,  pancreas, adrenal glands, kidneys, urinary bladder, osseous  structures have a normal appearance. The bowel is unremarkable  with no evidence of gross obstruction or inflammation. There is no  adenopathy, free fluid, or pneumoperitoneum within the abdomen or  pelvis.  IMPRESSION:  There is an abscess within the subcutaneous fat of the midline  abdomen at and above the level of the umbilicus. The anterior  abdominal wall musculature is intact.  Micro results   Assessment and Plan: Infected postoperative infraumbilical incision seroma  We discussed several options including repeat aspiration in the office versus returning to the operating room for incision and drainage. We discussed the pros and cons of each approach. We discussed the risk and benefits of returning to the operating room such as bleeding, continued infection, injury to surrounding structures, blood clot formation, scarring, continued pain, and the need to leave the incision open to let heal by secondary intention. We discussed the typical postoperative course.  After discussion, the patient has elected  to return to the operating room for incision and drainage of this postoperative infected seroma. We will try to get him into the operating room as soon as possible. In the interim he is to  continue his antibiotics.  Mary Sella. Andrey Campanile, MD, FACS General, Bariatric, & Minimally Invasive Surgery [facility], [place]

## 2011-05-06 NOTE — Op Note (Signed)
05/06/2011  Abby B Sainvil [id number]  Preoperative diagnosis: Postoperative seroma status post primary umbilical hernia repair  Postoperative diagnosis: Base of umbilical skin defect  Procedure: Exploration of prior primary umbilical hernia repair Closure of umbilical skin defect  Surgeon: Atilano Ina  Anesthesia: General and 20 cc of Exparel  EBL: Minimal  Findings: There is no skin cellulitis. There is no induration or fluctuance. The prior umbilical hernia repair was intact. There is no evidence of fascial defect. The umbilical skin at the base had a defect of about 0.5 cm.  Indications for procedure: Patient is a very pleasant 26 year old Hispanic male who underwent a recent primary umbilical hernia repair about 3 weeks ago. He presented to the office complaining of pain to the right of this incision as well as drainage from the actual umbilical area and not from the incision. There was no signs of infection at that office visit. He was reassured. He returned several days later with ongoing drainage from the base of his umbilicus as well as continued pain. A CT scan was ordered which demonstrated a subcutaneous fluid collection. There was no evidence of intra-abdominal process. I saw the patient in the clinic yesterday. He complains of ongoing drainage from the base of his umbilicus as well as continued pain. There was no evidence of superficial skin infection. But because of his ongoing discomfort and drainage from his umbilicus, we discussed several options such as observation versus needle aspiration in the office versus reexploration in the operating room. We discussed the pros and cons of each approach. The patient elected to proceed to the operating room for exploration. We discussed the risk and benefits of surgery including but not limited to bleeding, infection, injury to surrounding structures, need to leave the wound open and let it heal by secondary intention, blood clot  formation, anesthesia risk, scarring, and hernia recurrence. He elected to proceed to the operating room  Description of procedure: After obtaining informed consent, the patient was taken to the operating room 1 at [facility] and placed supine on the operating room table. General LMA anesthesia was established. Sequential compression devices were placed. He received antibiotics prior to skin incision. His abdomen was prepped and draped in the usual standard surgical fashion. A surgical timeout was performed. I infiltrated 0.25% Marcaine with epi in his old previous transverse infraumbilical incision. I then incised the skin with a #15 blade. The deep dermis was divided sharply. The previous interrupted 3-0 Vicryl sutures thru the deep dermis were removed. There was no purulence. The fascia was intact. The previous Novafil sutures were identified and inspected. They were intact. The 3-0 Vicryl sutures that had been placed to secure the base of the umbilical skin were intact; however, the umbilicus was no longer attached to the suture. The base of the umbilical skin had a 0.5 cm gap in it. The umbilical skin was reapproximated with interrupted with 4 interrupted 3-0 chromic sutures. The cavity was irrigated with antibiotic saline. Hemostasis was achieved. I injected some external in the deep dermis, subcutaneous tissue, and fascia. The skin was partially closed with 2 skin staples at either end of the transverse incision.the wound was packed with a moistened 4 x 4 gauze. A dry dressing was then applied. All needle instrument and sponge counts were correct x2. There are no immediate complications. The patient tolerated the procedure well. He was extubated and taken to the recovery room in stable condition.  Mary Sella. Andrey Campanile, MD, FACS General, Bariatric, & Minimally  Invasive Surgery [facility], [place]

## 2011-05-06 NOTE — Progress Notes (Signed)
EKG, CXR not necessary per anesthesia protocol

## 2011-05-06 NOTE — Interval H&P Note (Signed)
History and Physical Interval Note:  05/06/2011 1:11 PM  Travis Walker  has presented today for surgery, with the diagnosis of infected infraumbilical seroma  The various methods of treatment have been discussed with the patient and family. After consideration of risks, benefits and other options for treatment, the patient has consented to  Procedure(s): INCISION AND DRAINAGE INFRAUMBILICAL SEROMA as a surgical intervention .  The patients' history has been reviewed, patient examined, no change in status, stable for surgery.  I have reviewed the patients' chart and labs.  Questions were answered to the patient's satisfaction.    Mary Sella. Andrey Campanile, MD, FACS General, Bariatric, & Minimally Invasive Surgery [facility], [place]    [facility]

## 2011-05-06 NOTE — Transfer of Care (Signed)
Immediate Anesthesia Transfer of Care Note  Patient: Travis Walker  Procedure(s) Performed:  INCISION AND DRAINAGE ABSCESS - drainage of seroma and closure of umbilical skin  Patient Location: PACU  Anesthesia Type: General  Level of Consciousness: awake and alert   Airway & Oxygen Therapy: Patient Spontanous Breathing and Patient connected to face mask oxygen  Post-op Assessment: Report given to PACU RN and Post -op Vital signs reviewed and stable  Post vital signs: Reviewed and stable  Complications: No apparent anesthesia complications

## 2011-05-06 NOTE — Anesthesia Preprocedure Evaluation (Addendum)
Anesthesia Evaluation  Patient identified by MRN, date of birth, ID band Patient awake    Reviewed: Allergy & Precautions, H&P , NPO status , Patient's Chart, lab work & pertinent test results  Airway Mallampati: II TM Distance: >3 FB Neck ROM: full    Dental No notable dental hx. (+) Teeth Intact and Dental Advisory Given   Pulmonary neg pulmonary ROS, pneumonia ,  clear to auscultation  Pulmonary exam normal       Cardiovascular Exercise Tolerance: Good neg cardio ROS regular Normal    Neuro/Psych  Headaches, Negative Neurological ROS  Negative Psych ROS   GI/Hepatic negative GI ROS, Neg liver ROS,   Endo/Other  Negative Endocrine ROS  Renal/GU negative Renal ROS  Genitourinary negative   Musculoskeletal   Abdominal   Peds  Hematology negative hematology ROS (+)   Anesthesia Other Findings   Reproductive/Obstetrics negative OB ROS                          Anesthesia Physical Anesthesia Plan  ASA: I  Anesthesia Plan: General   Post-op Pain Management:    Induction: Intravenous  Airway Management Planned: LMA  Additional Equipment:   Intra-op Plan:   Post-operative Plan:   Informed Consent: I have reviewed the patients History and Physical, chart, labs and discussed the procedure including the risks, benefits and alternatives for the proposed anesthesia with the patient or authorized representative who has indicated his/her understanding and acceptance.   Dental Advisory Given  Plan Discussed with: CRNA and Surgeon  Anesthesia Plan Comments:         Anesthesia Quick Evaluation

## 2011-05-06 NOTE — Anesthesia Postprocedure Evaluation (Signed)
  Anesthesia Post-op Note  Patient: Travis Walker  Procedure(s) Performed:  INCISION AND DRAINAGE ABSCESS - drainage of seroma and closure of umbilical skin  Patient Location: PACU  Anesthesia Type: General  Level of Consciousness: awake and alert   Airway and Oxygen Therapy: Patient Spontanous Breathing  Post-op Pain: mild  Post-op Assessment: Post-op Vital signs reviewed, Patient's Cardiovascular Status Stable, Respiratory Function Stable, Patent Airway and No signs of Nausea or vomiting  Post-op Vital Signs: stable  Complications: No apparent anesthesia complications

## 2011-05-07 MED ORDER — OXYCODONE-ACETAMINOPHEN 5-325 MG PO TABS: 1.0000 | ORAL_TABLET | ORAL | Status: AC | PRN

## 2011-05-07 NOTE — Discharge Summary (Signed)
Physician Discharge Summary  Patient ID: Travis Walker MRN: [id number] DOB/AGE: 1986/02/09 26 y.o.  Admit date: 05/06/2011 Discharge date: 05/07/2011  Admission Diagnoses: Postoperative seroma s/p umbilical hernia repair  Discharge Diagnoses:  Base of umbilical skin defect  Discharged Condition: good  Hospital Course: The pt was taken to the operating room for exploration for persistent clear drainage from the base of his umbilicus, not from his incision. During surgery, it was found that the base of his umbilicus had become detached from his abdominal wall & developed a defect in the skin at the base of the umbilical skin. It was repaired. The prior incision was partially closed and packed with w-d dressing. ON pod 1, he was tolerating a diet. His vitals were stable. I had instructed and showed him and his wife on w-d dressing.   Consults: none  Significant Diagnostic Studies: none  Treatments: IV hydration, analgesia: acetaminophen, Morphine and oxycodone and surgery: Exploration of prior umbilical hernia repair and closure of umbilical skin defect  Discharge Exam: Blood pressure 91/53, pulse 63, temperature 98.1 F (36.7 C), temperature source Oral, resp. rate 12, height 5\' 8"  (1.727 m), weight 150 lb (68.04 kg), SpO2 100.00%. Alert, NAD CTA REG Soft, nd, +bs. Some serosang drainage on gauze. No cellulitis. Dressing changed.   Disposition: Home or Self Care  Discharge Orders    Future Appointments: Provider: Department: Dept Phone: Center:   05/12/2011 2:45 PM Atilano Ina, MD [facility] [phone] None     Future Orders Please Complete By Expires   Diet - low sodium heart healthy      Increase activity slowly      Lifting restrictions      Comments:   No lifting, pushing, pulling anything greater than 15 pounds for 4 weeks   Discharge wound care:      Comments:   Perform wet -dry dressing twice a day as instructed in the hospital.  Remove outer gauze and  tape.  May shower and wound can get wet then pat dry. Moisten a 2x2 gauze and pack into open incision and cover with dry gauze and tape. Perform at least once a day, preferably twice a day. May take a pain pill 30-45 minutes prior to dressing change.     Medication List  As of 05/07/2011  8:29 AM   STOP taking these medications         acetaminophen 325 MG tablet      sulfamethoxazole-trimethoprim 800-160 MG per tablet         TAKE these medications         oxyCODONE-acetaminophen 5-325 MG per tablet   Commonly known as: PERCOCET   Take 1-2 tablets by mouth every 4 (four) hours as needed for pain.           Follow-up Information    Follow up with Atilano Ina, MD in 2 weeks.   Contact information:   [employer] [address] [place] [address] [phone]         Mary Sella. Andrey Campanile, MD, FACS General, Bariatric, & Minimally Invasive Surgery [facility], [place]   Signed: Atilano Ina 05/07/2011, 8:29 AM

## 2011-05-07 NOTE — Plan of Care (Signed)
Problem: Phase I Progression Outcomes Goal: Incision/dressings dry and intact Outcome: Progressing Reinforced dsg at 1950 ob 05/06/11 . Surgical dressing marked and saturated.

## 2011-05-12 ENCOUNTER — Encounter (INDEPENDENT_AMBULATORY_CARE_PROVIDER_SITE_OTHER): Payer: Worker's Compensation | Admitting: General Surgery

## 2011-05-21 ENCOUNTER — Ambulatory Visit (INDEPENDENT_AMBULATORY_CARE_PROVIDER_SITE_OTHER): Payer: Worker's Compensation | Admitting: Surgery

## 2011-05-21 ENCOUNTER — Encounter (INDEPENDENT_AMBULATORY_CARE_PROVIDER_SITE_OTHER): Payer: Self-pay | Admitting: Surgery

## 2011-05-21 VITALS — BP 110/72 | HR 68 | Temp 97.2°F | Resp 12 | Ht 68.0 in | Wt 147.4 lb

## 2011-05-21 DIAGNOSIS — Z9889 Other specified postprocedural states: Secondary | ICD-10-CM

## 2011-05-21 NOTE — Patient Instructions (Signed)
We will see you again on an as needed basis. Please call the office at [phone] if you have any questions or concerns. Thank you for allowing us to take care of you.  

## 2011-05-21 NOTE — Progress Notes (Signed)
NAME: EBERT FORRESTER                                            DOB: 05-Jun-1985 DATE: 05/21/2011                                                  MRN: [id number]  CC: Post op   HPI: This patient comes in for post op follow-up.Heunderwent umbilical hernia repair by Dr Andrey Campanile and had a seroma drained. He is back for a wound check and staple removal. No pain. He feels that he is doing well.  PE: General: The patient appears to be healthy, NAD Wound healing, no seroma, mild edema of unbilicus  DATA REVIEWED: NO new  IMPRESSION: The patient is doing well S/P hernia repair.    PLAN: Cherlynn Polo out RTCPRN

## 2011-05-28 ENCOUNTER — Encounter (INDEPENDENT_AMBULATORY_CARE_PROVIDER_SITE_OTHER): Payer: Self-pay | Admitting: General Surgery

## 2011-05-28 ENCOUNTER — Ambulatory Visit (INDEPENDENT_AMBULATORY_CARE_PROVIDER_SITE_OTHER): Payer: Worker's Compensation | Admitting: General Surgery

## 2011-05-28 VITALS — BP 118/66 | HR 64 | Temp 97.5°F | Resp 16 | Ht 68.0 in | Wt 151.2 lb

## 2011-05-28 DIAGNOSIS — Z09 Encounter for follow-up examination after completed treatment for conditions other than malignant neoplasm: Secondary | ICD-10-CM

## 2011-05-28 NOTE — Progress Notes (Signed)
Chief complaint: Postop  Procedure: Status post open primary repair of umbilical hernia January 3; status post repair of umbilical stalk defect January 22  History of Present Ilness: 26 year old Hispanic male comes in for followup. His postoperative course was complicated by a umbilical stalk defect where he was draining a seroma through it. He comes in today complaining of intermittent drainage through his umbilicus. He denies any fevers, chills, nausea, vomiting, diarrhea, or constipation. The drainage is clear. He reports some scattered tenderness in his abdominal wall  Physical Exam: BP 118/66  Pulse 64  Temp(Src) 97.5 F (36.4 C) (Temporal)  Resp 16  Ht 5\' 8"  (1.727 m)  Wt 151 lb 3.2 oz (68.584 kg)  BMI 22.99 kg/m2  Gen: alert, NAD, non-toxic appearing Pupils: equal, no scleral icterus Abd: soft, nontender, nondistended. Well-healed infraumbilical incision. No cellulitis. No incisional hernia. Umbilicus without signs of infection Skin: no rash, no jaundice   Assessment and Plan: Status post open repair of umbilical hernia followed by repair of umbilical stalk defect.  There is no sign of infection. There is no sign of hematoma or seroma. He is cleared to return to work with light duty for 2 weeks and then he can return to full duty after that.  Followup in 2 months  Mary Sella. Andrey Campanile, MD, FACS General, Bariatric, & Minimally Invasive Surgery [facility], [place]

## 2011-05-28 NOTE — Patient Instructions (Signed)
Light duty for 2 weeks then resume full activities

## 2011-06-04 ENCOUNTER — Encounter (HOSPITAL_COMMUNITY): Payer: Self-pay | Admitting: *Deleted

## 2011-06-04 ENCOUNTER — Emergency Department (HOSPITAL_COMMUNITY)
Admission: EM | Admit: 2011-06-04 | Discharge: 2011-06-04 | Disposition: A | Discharge status: Home / Self Care | Payer: Worker's Compensation | Attending: Emergency Medicine | Admitting: Emergency Medicine

## 2011-06-04 DIAGNOSIS — Z4889 Encounter for other specified surgical aftercare: Secondary | ICD-10-CM

## 2011-06-04 DIAGNOSIS — Z9889 Other specified postprocedural states: Secondary | ICD-10-CM | POA: Insufficient documentation

## 2011-06-04 DIAGNOSIS — Z09 Encounter for follow-up examination after completed treatment for conditions other than malignant neoplasm: Secondary | ICD-10-CM | POA: Insufficient documentation

## 2011-06-04 NOTE — ED Provider Notes (Signed)
Medical screening examination/treatment/procedure(s) were performed by non-physician practitioner and as supervising physician I was immediately available for consultation/collaboration.   Kingstin Heims L Ahmere Hemenway, MD 06/04/11 2238 

## 2011-06-04 NOTE — ED Provider Notes (Signed)
History     CSN: [id number]  Arrival date & time 06/04/11  1656   First MD Initiated Contact with Patient 06/04/11 1714      Chief Complaint  Patient presents with  . Wound Check    (Consider location/radiation/quality/duration/timing/severity/associated sxs/prior treatment) HPI Comments: Pt had umbilical hernia repair on 05-06-11 by dr. Andrey Campanile in [place].  Wife states they have been cleaning daily with soap( and wter.  Seen by dr. Andrey Campanile on 05-28-11 and told it was not infected.  Wife noted small amount of "discharge" on q-tip  And it had a little blood on it today.  Pt has had no fever or chills.  He is eating and drinking well.  Patient is a 26 y.o. male presenting with wound check. The history is provided by the patient and the spouse. No language interpreter was used.  Wound Check  His temperature was unmeasured prior to arrival.    Past Medical History  Diagnosis Date  . Headache     migraines  . Pneumonia 2004    Past Surgical History  Procedure Date  . Umbilical hernia repair 04/17/2011    Procedure: HERNIA REPAIR UMBILICAL ADULT;  Surgeon: Atilano Ina, MD;  Location: [facility] OR;  Service: General;  Laterality: N/A;  . Hernia repair     No family history on file.  History  Substance Use Topics  . Smoking status: Never Smoker   . Smokeless tobacco: Never Used  . Alcohol Use: Yes     occassional      Review of Systems  Constitutional: Negative for fever and chills.  Skin: Positive for wound.  All other systems reviewed and are negative.    Allergies  Review of patient's allergies indicates no known allergies.  Home Medications  No current outpatient prescriptions on file.  BP 125/81  Pulse 83  Temp(Src) 97.6 F (36.4 C) (Oral)  Resp 16  Ht 5\' 8"  (1.727 m)  Wt 151 lb (68.493 kg)  BMI 22.96 kg/m2  SpO2 100%  Physical Exam  Nursing note and vitals reviewed. Constitutional: He is oriented to person, place, and time. He appears well-developed and  well-nourished.  HENT:  Head: Normocephalic and atraumatic.  Eyes: EOM are normal.  Neck: Normal range of motion.  Cardiovascular: Normal rate, regular rhythm, normal heart sounds and intact distal pulses.   Pulmonary/Chest: Effort normal and breath sounds normal. No respiratory distress.  Abdominal: Soft. He exhibits no distension. There is no tenderness.  Musculoskeletal: Normal range of motion.       Umbilical surgical site appears well-healed.  i cleaned out with q-tip and saw no purulent d/c and no visible wound at umbilical base.  Several flecks of dried blood noted on q-tip.  i applied bacitracin inside umbilicus.  Neurological: He is alert and oriented to person, place, and time.  Skin: Skin is warm and dry.  Psychiatric: He has a normal mood and affect. Judgment normal.    ED Course  Procedures (including critical care time)  Labs Reviewed - No data to display No results found.   No diagnosis found.    MDM          Worthy Rancher, PA 06/04/11 [phone]

## 2011-06-04 NOTE — ED Notes (Signed)
Pt had hernia repair Jan 22. Noticed discharge and small amount of blood to belly button. Surgery done by Dr. Andrey Campanile. Pt states "pinching pain to site" Symptoms for past two weeks

## 2011-06-04 NOTE — ED Notes (Signed)
Pt presents to the ED for a post-operative wound evaluation. Pt states has bloody drainage from umbilicus incision, also c/o abd pain. Spouse states returned to Dr wilson's office for a recheck on 13 th February with no new orders. Pt states he is not getting better. Fearful to return to work secondary to drainage and pain, fearful not to return for repercussion of job loss.  No drainage or odor noted.

## 2011-06-04 NOTE — Discharge Instructions (Signed)
i don't see any apparent signs of infection.  Watch umbilicus twice daily with soap and water then apply a sparing amount of antibiotic ointment.  If you note worsening pain, any redness, swelling  Or drainage of pus or blood, you should contact dr. Andrey Campanile right away.

## 2011-07-17 ENCOUNTER — Telehealth (INDEPENDENT_AMBULATORY_CARE_PROVIDER_SITE_OTHER): Payer: Self-pay | Admitting: General Surgery

## 2011-07-17 NOTE — Telephone Encounter (Signed)
Pt's case worker wants to try to come with him to next appt, to help him understand his medical issue and treatment.  His language barrier and non-medical background has him calling the case worker and she cannot help him much.  She would be able to come to an appt on 07/23/11 if anything opens up with Dr. Andrey Campanile that day.  (His appt is on 07/25/11, but she has a conflict.)  Please call her if he can move into a cancellation:  [phone].

## 2011-07-18 NOTE — Telephone Encounter (Signed)
Spoke with case Production designer, theatre/television/film. Appt moved to [phone]. Case manager will contact patient with new appt.

## 2011-07-18 NOTE — Telephone Encounter (Signed)
Called back. Dr Andrey Campanile has created an office on 07/29/11 and will offer appt then. Awaiting call.

## 2011-07-25 ENCOUNTER — Encounter (INDEPENDENT_AMBULATORY_CARE_PROVIDER_SITE_OTHER): Payer: Worker's Compensation | Admitting: General Surgery

## 2011-07-29 ENCOUNTER — Ambulatory Visit (INDEPENDENT_AMBULATORY_CARE_PROVIDER_SITE_OTHER): Payer: Worker's Compensation | Admitting: General Surgery

## 2011-07-29 ENCOUNTER — Encounter (INDEPENDENT_AMBULATORY_CARE_PROVIDER_SITE_OTHER): Payer: Self-pay | Admitting: General Surgery

## 2011-07-29 VITALS — BP 92/58 | HR 68 | Temp 98.0°F | Resp 16 | Ht 68.0 in | Wt 154.2 lb

## 2011-07-29 DIAGNOSIS — Z09 Encounter for follow-up examination after completed treatment for conditions other than malignant neoplasm: Secondary | ICD-10-CM

## 2011-07-29 NOTE — Patient Instructions (Signed)
Can take motrin or tylenol for the intermittent discomfort

## 2011-07-29 NOTE — Progress Notes (Signed)
Subjective:     Patient ID: Travis Walker, male   DOB: Mar 30, 1986, 26 y.o.   MRN: [id number]  HPI 25yo HM comes in for follow-up. He underwent open umbilical hernia without mesh on 04/17/11; however, he developed drainage from his umbilical stalk and was taken back to the OR for washout and partial closure on 1/22. I last saw him on 2/13. He denies any fevers. He reports a good appetite. He denies any diarrhea or constipation or drainage from his incision. He states he will have a "pinching" sensation after working for several days. It is intermittent and generally occurs after working for several hours. It doesn't radiate. He rates it as a 5/10. He also relays he will have some occasional itching in that area.   Review of Systems     Objective:   Physical Exam BP 92/58  Pulse 68  Temp(Src) 98 F (36.7 C) (Temporal)  Resp 16  Ht 5\' 8"  (1.727 m)  Wt 154 lb 3.2 oz (69.945 kg)  BMI 23.45 kg/m2 Alert, nad cta  reg Soft, nt, nd. Well healed infraumbilical incision. No hernia recurrence. No cellulitis. No fluctuance. No hematoma/seroma/bulge/induration    Assessment:     S/p open primary umbilical hernia repair; s/p evacuation of seroma and partial skin closure    Plan:     Overall, doing well. i'm pleased with how the incision looks. There is no sign of infection or hernia recurrence. I reassured him and his wife that this pinching should improve with time. I recommended that he could take tylenol or motrin when this occurs. He can return to full duty. The hernia repair is as strong as it will be at this point. 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 F/u prn  Mary Sella. Andrey Campanile, MD, FACS General, Bariatric, & Minimally Invasive Surgery [facility], [place]

## 2011-08-18 ENCOUNTER — Telehealth (INDEPENDENT_AMBULATORY_CARE_PROVIDER_SITE_OTHER): Payer: Self-pay | Admitting: General Surgery

## 2011-08-18 NOTE — Telephone Encounter (Signed)
Message copied by Liliana Cline on Mon Aug 18, 2011 10:12 AM ------      Message from: Andrey Campanile, ERIC M      Created: Fri Aug 15, 2011  7:31 PM       pls schedule pt for a f/u appt with me. Apparently he his still having issues. I got a letter from his worker's Occupational hygienist.             Thanks,      wilson

## 2011-08-18 NOTE — Telephone Encounter (Signed)
Appt made for follow up on 09/10/11 @ 9:00 am. Spoke with patient's wife.

## 2011-09-10 ENCOUNTER — Encounter (INDEPENDENT_AMBULATORY_CARE_PROVIDER_SITE_OTHER): Payer: Worker's Compensation | Admitting: General Surgery

## 2011-11-13 ENCOUNTER — Encounter (INDEPENDENT_AMBULATORY_CARE_PROVIDER_SITE_OTHER): Payer: Worker's Compensation | Admitting: General Surgery

## 2011-11-17 ENCOUNTER — Encounter (INDEPENDENT_AMBULATORY_CARE_PROVIDER_SITE_OTHER): Payer: Self-pay | Admitting: General Surgery

## 2013-07-07 ENCOUNTER — Emergency Department (HOSPITAL_COMMUNITY)
Admission: EM | Admit: 2013-07-07 | Discharge: 2013-07-07 | Disposition: A | Discharge status: Home / Self Care | Payer: Worker's Compensation | Attending: Emergency Medicine | Admitting: Emergency Medicine

## 2013-07-07 ENCOUNTER — Encounter (HOSPITAL_COMMUNITY): Payer: Self-pay | Admitting: Emergency Medicine

## 2013-07-07 DIAGNOSIS — B9789 Other viral agents as the cause of diseases classified elsewhere: Secondary | ICD-10-CM | POA: Insufficient documentation

## 2013-07-07 DIAGNOSIS — B349 Viral infection, unspecified: Secondary | ICD-10-CM

## 2013-07-07 DIAGNOSIS — Z8679 Personal history of other diseases of the circulatory system: Secondary | ICD-10-CM | POA: Insufficient documentation

## 2013-07-07 DIAGNOSIS — D696 Thrombocytopenia, unspecified: Secondary | ICD-10-CM

## 2013-07-07 DIAGNOSIS — Z8701 Personal history of pneumonia (recurrent): Secondary | ICD-10-CM | POA: Insufficient documentation

## 2013-07-07 LAB — BASIC METABOLIC PANEL
BUN: 16 mg/dL (ref 6–23)
CALCIUM: 8.8 mg/dL (ref 8.4–10.5)
CO2: 25 meq/L (ref 19–32)
Chloride: 105 mEq/L (ref 96–112)
Creatinine, Ser: 0.84 mg/dL (ref 0.50–1.35)
GFR calc Af Amer: >90 mL/min (ref >90)
GFR calc non Af Amer: >90 mL/min (ref >90)
GLUCOSE: 104 mg/dL — AB (ref 70–99)
Potassium: 3.9 mEq/L (ref 3.7–5.3)
SODIUM: 140 meq/L (ref 137–147)

## 2013-07-07 LAB — CBC WITH DIFFERENTIAL/PLATELET
Basophils Absolute: 0 10*3/uL (ref 0.0–0.1)
Basophils Relative: 1 % (ref 0–1)
EOS PCT: 1 % (ref 0–5)
Eosinophils Absolute: 0 10*3/uL (ref 0.0–0.7)
HEMATOCRIT: 47.3 % (ref 39.0–52.0)
Hemoglobin: 16.2 g/dL (ref 13.0–17.0)
LYMPHS ABS: 0.4 10*3/uL — AB (ref 0.7–4.0)
LYMPHS PCT: 24 % (ref 12–46)
MCH: 30.6 pg (ref 26.0–34.0)
MCHC: 34.2 g/dL (ref 30.0–36.0)
MCV: 89.4 fL (ref 78.0–100.0)
MONO ABS: 0.1 10*3/uL (ref 0.1–1.0)
Monocytes Relative: 7 % (ref 3–12)
Neutro Abs: 1.3 10*3/uL — ABNORMAL LOW (ref 1.7–7.7)
Neutrophils Relative %: 68 % (ref 43–77)
Platelets: 50 10*3/uL — ABNORMAL LOW (ref 150–400)
RBC: 5.29 MIL/uL (ref 4.22–5.81)
RDW: 13 % (ref 11.5–15.5)
WBC: 1.9 10*3/uL — AB (ref 4.0–10.5)

## 2013-07-07 LAB — URINE MICROSCOPIC-ADD ON

## 2013-07-07 LAB — URINALYSIS, ROUTINE W REFLEX MICROSCOPIC
GLUCOSE, UA: NEGATIVE mg/dL
HGB URINE DIPSTICK: NEGATIVE
Ketones, ur: 15 mg/dL — AB
Leukocytes, UA: NEGATIVE
Nitrite: NEGATIVE
PH: 6 (ref 5.0–8.0)
PROTEIN: 30 mg/dL — AB
Specific Gravity, Urine: >1.03 — ABNORMAL HIGH (ref 1.005–1.030)
Urobilinogen, UA: 0.2 mg/dL (ref 0.0–1.0)

## 2013-07-07 LAB — RAPID STREP SCREEN (MED CTR MEBANE ONLY): STREPTOCOCCUS, GROUP A SCREEN (DIRECT): NEGATIVE

## 2013-07-07 MED ORDER — IBUPROFEN 800 MG PO TABS
800.0000 mg | ORAL_TABLET | Freq: Once | ORAL | Status: AC
  Administered 2013-07-07: 800 mg via ORAL
  Filled 2013-07-07: qty 1

## 2013-07-07 MED ORDER — FAMOTIDINE 20 MG PO TABS: 20.0000 mg | ORAL_TABLET | Freq: Two times a day (BID) | ORAL | Status: DC

## 2013-07-07 MED ORDER — PROMETHAZINE HCL 25 MG PO TABS: 25.0000 mg | ORAL_TABLET | Freq: Four times a day (QID) | ORAL | Status: DC | PRN

## 2013-07-07 MED ORDER — PANTOPRAZOLE SODIUM 40 MG PO TBEC
40.0000 mg | DELAYED_RELEASE_TABLET | Freq: Once | ORAL | Status: AC
  Administered 2013-07-07: 40 mg via ORAL
  Filled 2013-07-07: qty 1

## 2013-07-07 MED ORDER — ONDANSETRON 8 MG PO TBDP
8.0000 mg | ORAL_TABLET | Freq: Once | ORAL | Status: AC
  Administered 2013-07-07: 8 mg via ORAL
  Filled 2013-07-07: qty 1

## 2013-07-07 NOTE — ED Provider Notes (Signed)
CSN: [id number]     Arrival date & time 07/07/13  1018 History   First MD Initiated Contact with Patient 07/07/13 1035     Chief Complaint  Patient presents with  . Fever     (Consider location/radiation/quality/duration/timing/severity/associated sxs/prior Treatment) HPI Comments: Travis Walker is a 28 y.o. male who presents to the Emergency Department complaining of fever, chills that began approximately 24 hours ago. Patient reports onset was sudden. He also complains of generalized body aches, frontal headache, generalized malaise, and nausea vomiting. States that he vomited one time last evening. He describes the headache as a throbbing sensation to the front of his head. He denies dysuria, cough or chest congestion, abdominal pain, neck pain or stiffness or runny nose.  He states that he took Tylenol this morning without relief. Nothing makes the symptoms better or worse.  The history is provided by the patient.    Past Medical History  Diagnosis Date  . Headache(784.0)     migraines  . Pneumonia 2004   Past Surgical History  Procedure Laterality Date  . Umbilical hernia repair  04/17/2011    Procedure: HERNIA REPAIR UMBILICAL ADULT;  Surgeon: Atilano InaEric M Wilson, MD;  [facility];  Service: General;  Laterality: N/A;  . Hernia repair     History reviewed. No pertinent family history. History  Substance Use Topics  . Smoking status: Never Smoker   . Smokeless tobacco: Never Used  . Alcohol Use: No     Comment: occassional    Review of Systems  Constitutional: Positive for fever, chills and fatigue. Negative for appetite change.  HENT: Negative for congestion, rhinorrhea, sneezing and sore throat.   Respiratory: Negative for chest tightness and shortness of breath.   Cardiovascular: Negative for chest pain.  Gastrointestinal: Positive for nausea and vomiting. Negative for abdominal pain, diarrhea, blood in stool and abdominal distention.  Genitourinary: Negative for  dysuria, frequency, flank pain, decreased urine volume and difficulty urinating.  Musculoskeletal: Positive for myalgias. Negative for back pain, neck pain and neck stiffness.  Skin: Negative for color change and rash.  Neurological: Positive for headaches. Negative for dizziness, syncope, speech difficulty, weakness, light-headedness and numbness.  Hematological: Negative for adenopathy.  All other systems reviewed and are negative.      Allergies  Review of patient's allergies indicates no known allergies.  Home Medications  No current outpatient prescriptions on file. BP 115/70  Pulse 79  Temp(Src) 98.1 F (36.7 C) (Oral)  Resp 18  Ht 5\' 8"  (1.727 m)  Wt 160 lb (72.576 kg)  BMI 24.33 kg/m2  SpO2 97% Physical Exam  Nursing note and vitals reviewed. Constitutional: He is oriented to person, place, and time. He appears well-developed and well-nourished. No distress.  HENT:  Head: Normocephalic and atraumatic.  Mouth/Throat: Uvula is midline and mucous membranes are normal. Posterior oropharyngeal erythema present. No oropharyngeal exudate, posterior oropharyngeal edema or tonsillar abscesses.  Eyes: Conjunctivae and EOM are normal. Pupils are equal, round, and reactive to light.  Neck: Normal range of motion, full passive range of motion without pain and phonation normal. Neck supple. No spinous process tenderness and no muscular tenderness present. No rigidity. No Brudzinski's sign and no Kernig's sign noted. No thyromegaly present.  Cardiovascular: Normal rate, regular rhythm, normal heart sounds and intact distal pulses.   No murmur heard. Pulmonary/Chest: Effort normal and breath sounds normal. No respiratory distress. He has no wheezes. He has no rales. He exhibits no tenderness.  Abdominal: Soft. He exhibits  no distension and no mass. There is no tenderness. There is no rebound and no guarding.  Musculoskeletal: Normal range of motion.  Lymphadenopathy:    He has no  cervical adenopathy.  Neurological: He is alert and oriented to person, place, and time. He has normal strength. No cranial nerve deficit or sensory deficit. He exhibits normal muscle tone. Coordination and gait normal. GCS eye subscore is 4. GCS verbal subscore is 5. GCS motor subscore is 6.  Reflex Scores:      Tricep reflexes are 2+ on the right side and 2+ on the left side.      Bicep reflexes are 2+ on the right side and 2+ on the left side. Skin: Skin is warm and dry.  Psychiatric: He has a normal mood and affect.    ED Course  Procedures (including critical care time) Labs Review Labs Reviewed  CBC WITH DIFFERENTIAL - Abnormal; Notable for the following:    WBC 1.9 (*)    Platelets 50 (*)    Neutro Abs 1.3 (*)    Lymphs Abs 0.4 (*)    All other components within normal limits  BASIC METABOLIC PANEL - Abnormal; Notable for the following:    Glucose, Bld 104 (*)    All other components within normal limits  URINALYSIS, ROUTINE W REFLEX MICROSCOPIC - Abnormal; Notable for the following:    Specific Gravity, Urine >1.030 (*)    Bilirubin Urine SMALL (*)    Ketones, ur 15 (*)    Protein, ur 30 (*)    All other components within normal limits  RAPID STREP SCREEN  CULTURE, GROUP A STREP  URINE MICROSCOPIC-ADD ON   Imaging Review No results found.   EKG Interpretation None      MDM   Final diagnoses:  Viral illness  Thrombocytopenia    Patient is non-toxic appearing.  Mucous membranes moist.  No meningeal signs.  Sudden onset sx's that appears c/w viral illness.  Will check labs,  Patient is feeling better and drank fluids w/o difficulty. Vital improved.  Pt has leukocytosis and recurrent  Thrombocytopenia.  No hx of recent tick bite.    Labs and pt hx discussed with Dr. Estell Harpin.   Patient appears stable for discharge and agrees to close f/u with his PMD to have labs rechecked.  Also advised him to return if sx's not improving.  Pt verbalized understanding and agrees  to plan     Ichael Pullara L. Trisha Mangle, PA-C 07/08/13 2052

## 2013-07-07 NOTE — Discharge Instructions (Signed)
Infecciones virales  (Viral Infections)  Un virus es un tipo de germen. Puede causar:   Dolor de garganta leve.  Dolores musculares.  Dolor de Turkmenistancabeza.  Secrecin nasal.  Erupciones.  Lagrimeo.  Cansancio.  Tos.  Prdida del apetito.  Ganas de vomitar (nuseas).  Vmitos.  Materia fecal lquida (diarrea). CUIDADOS EN EL HOGAR   Tome la medicacin slo como le haya indicado el mdico.  Beba gran cantidad de lquido para mantener la orina de tono claro o color amarillo plido. Las bebidas deportivas son Nadara Modeuna buena eleccin.  Descanse lo suficiente y Abbott Laboratoriesalimntese bien. Puede tomar sopas y caldos con crackers o arroz. SOLICITE AYUDA DE INMEDIATO SI:   Siente un dolor de cabeza muy intenso.  Le falta el aire.  Tiene dolor en el pecho o en el cuello.  Tiene una erupcin que no tena antes.  No puede detener los vmitos.  Tiene una hemorragia que no se detiene.  No puede retener los lquidos.  Usted o el nio tienen una temperatura oral le sube a ms de 38,9 C (102 F), y no puede bajarla con medicamentos.  Su beb tiene ms de 3 meses y su temperatura rectal es de 102 F (38.9 C) o ms.  Su beb tiene 3 meses o menos y su temperatura rectal es de 100.4 F (38 C) o ms. ASEGRESE DE QUE:   Comprende estas instrucciones.  Controlar la enfermedad.  Solicitar ayuda de inmediato si no mejora o si empeora. Document Released: 09/02/2010 Document Revised: 06/23/2011 [facility] Patient Information 2014 [place], [place].  Trombocitopenia  (Thrombocytopenia)  Trombocitopenia significa que la sangre no contiene la cantidad suficiente de plaquetas. Las plaquetas son pequeas clulas que estn en la sangre. Cuando hay un sangrado, las plaquetas se agrupan alrededor del corte o la lesin para Therapist, musicdetener el sangrado. Esto se conoce como coagulacin de Risk managerla sangre. Cuando no se tiene la cantidad suficiente de plaquetas puede haber problemas hemorrgicos. CUIDADOS EN EL HOGAR    Controle su piel y el interior de su boca para ver si hay hematomas o sangre, segn las indicaciones de su mdico.  Controle cuando salive (esputo), haga pis (orine) y vaya de cuerpo (heces) para ver si hay sangre, segn le indique su mdico.  No realice actividades que puedan causar bultos o hematomas hasta que su mdico lo autorice.  Tenga cuidado de no cortarse cuando use tijeras, agujas, cuchillos u otros utensilios.  Tenga cuidado de no quemarse al planchar o al cocinar.  Consulte con su mdico si puede beber alcohol.  Tome slo los medicamentos que le haya indicado el mdico.  Informe a sus mdicos y al dentista que tiene este problema hemorrgico. SOLICITE AYUDA DE INMEDIATO SI:   Tiene hemorragias en algn lugar del cuerpo.  Tiene sangrado o hematomas sin saber porqu.  Observa sangre cuando escupe, orina o va de cuerpo. ASEGRESE DE QUE:   Comprende estas instrucciones.  Controlar su enfermedad.  Solicitar ayuda de inmediato si no mejora o si empeora. Document Released: 03/20/2011 Document Revised: 06/23/2011 [facility] Patient Information 2014 [place]Care, [place].

## 2013-07-07 NOTE — ED Notes (Signed)
Pt reports feeling febrile and having chillssince yesterday morning. Denies n/v/d, coughing/sneezing, sick contacts.

## 2013-07-09 LAB — CULTURE, GROUP A STREP

## 2013-07-13 NOTE — ED Provider Notes (Signed)
Medical screening examination/treatment/procedure(s) were performed by non-physician practitioner and as supervising physician I was immediately available for consultation/collaboration.   EKG Interpretation None        Aamilah Augenstein L Earle Troiano, MD 07/13/13 1508 

## 2019-11-20 ENCOUNTER — Emergency Department (HOSPITAL_COMMUNITY)
Admission: EM | Admit: 2019-11-20 | Discharge: 2019-11-20 | Disposition: A | Discharge status: Home / Self Care | Payer: PRIVATE HEALTH INSURANCE | Attending: Emergency Medicine | Admitting: Emergency Medicine

## 2019-11-20 ENCOUNTER — Emergency Department (HOSPITAL_COMMUNITY): Payer: PRIVATE HEALTH INSURANCE

## 2019-11-20 ENCOUNTER — Encounter (HOSPITAL_COMMUNITY): Payer: Self-pay | Admitting: Emergency Medicine

## 2019-11-20 ENCOUNTER — Other Ambulatory Visit: Payer: Self-pay

## 2019-11-20 DIAGNOSIS — S0101XA Laceration without foreign body of scalp, initial encounter: Secondary | ICD-10-CM | POA: Diagnosis not present

## 2019-11-20 DIAGNOSIS — Y9289 Other specified places as the place of occurrence of the external cause: Secondary | ICD-10-CM | POA: Diagnosis not present

## 2019-11-20 DIAGNOSIS — Y998 Other external cause status: Secondary | ICD-10-CM | POA: Insufficient documentation

## 2019-11-20 DIAGNOSIS — W11XXXA Fall on and from ladder, initial encounter: Secondary | ICD-10-CM | POA: Insufficient documentation

## 2019-11-20 DIAGNOSIS — S0990XA Unspecified injury of head, initial encounter: Secondary | ICD-10-CM | POA: Insufficient documentation

## 2019-11-20 DIAGNOSIS — S8012XA Contusion of left lower leg, initial encounter: Secondary | ICD-10-CM | POA: Diagnosis not present

## 2019-11-20 DIAGNOSIS — Y9389 Activity, other specified: Secondary | ICD-10-CM | POA: Diagnosis not present

## 2019-11-20 MED ORDER — IBUPROFEN 400 MG PO TABS
600.0000 mg | ORAL_TABLET | Freq: Once | ORAL | Status: AC
  Administered 2019-11-20: 600 mg via ORAL
  Filled 2019-11-20: qty 2

## 2019-11-20 MED ORDER — LIDOCAINE-EPINEPHRINE (PF) 2 %-1:200000 IJ SOLN
5.0000 mL | Freq: Once | INTRAMUSCULAR | Status: AC
  Administered 2019-11-20: 5 mL
  Filled 2019-11-20: qty 10

## 2019-11-20 NOTE — Discharge Instructions (Addendum)
Wound care: Keep your wounds clean. In 24 hours, you can get your scalp wound wet; gently clean it with soap and water in the shower and then pat it dry. You can take ibuprofen/advil every 6hours as needed for pain. Apply ice for 20 minutes at a time to help with pain and swelling. Follow up with your primary care or urgent care for wound recheck in 5-7 days.  Return to the ER for fever, pus draining from wound, redness, or new or worsening symptoms.  Head Injury: Stay hydrated and get plenty of rest. Limit your screen time and complex thinking. Avoid any contact sports/activities and ladder climbing to prevent re-injury to your head. Follow up with your primary care provider in 1 week for re-check and to be cleared to return to normal activity. Return to the ER if you develop severely worsening headache, changes in your vision, persistent vomiting, or new or concerning symptoms.

## 2019-11-20 NOTE — ED Triage Notes (Addendum)
Pt reports was climbing on a ladder to finish a roof and reports ladder started sliding and pt reports fell with ladder and hit head on post of ladder. Pt reports ladder fell from approximately 6-11ft. Pt denies loc/being on blood thinners. Laceration with bleeding controlled noted to posterior head and left anterior shin. Bruise/swelling noted to LLE.   Gauze and telfa applied to head laceration and secured with conform wrapping in case site started to re-bleed.

## 2019-11-20 NOTE — ED Notes (Signed)
PA in to assess 

## 2019-11-20 NOTE — ED Provider Notes (Signed)
[facility] EMERGENCY DEPARTMENT Provider Note   CSN: [id number] Arrival date & time: 11/20/19  7048     History Chief Complaint  Patient presents with  . Fall    Travis Walker is a 34 y.o. male presenting to the ED with complaint of fall off a ladder that occurred prior to arrival. Pt states he was standing about 6-79ft high on a ladder. The foot of the ladder slid out and down and the ladder fell flat. He fell along with the ladder and towards his right side, striking his right occipital scalp on the ladder with associated laceration. No LOC. He also bumped his left shin causing small laceration. He reports pain to the scalp wound, denies N/V, vision changes, neck pain, or other injuries. Not on anticoagulation.   The history is provided by the patient.       Past Medical History:  Diagnosis Date  . Headache(784.0)    migraines  . Pneumonia 2004    There are no problems to display for this patient.   Past Surgical History:  Procedure Laterality Date  . HERNIA REPAIR    . UMBILICAL HERNIA REPAIR  04/17/2011   Procedure: HERNIA REPAIR UMBILICAL ADULT;  Surgeon: Atilano Ina, MD;  Location: [facility] OR;  Service: General;  Laterality: N/A;       History reviewed. No pertinent family history.  Social History   Tobacco Use  . Smoking status: Never Smoker  . Smokeless tobacco: Never Used  Substance Use Topics  . Alcohol use: No    Comment: occassional  . Drug use: No    Home Medications Prior to Admission medications   Medication Sig Start Date End Date Taking? Authorizing Provider  acetaminophen (TYLENOL) 500 MG tablet Take 1,000 mg by mouth every 6 (six) hours as needed for fever.    [provider]  Chlorphen-Phenyleph-ASA (ALKA-SELTZER PLUS COLD PO) Take 2 capsules by mouth every 6 (six) hours as needed (cold/cough).    [provider]  famotidine (PEPCID) 20 MG tablet Take 1 tablet (20 mg total) by mouth 2 (two) times daily. 07/07/13    Triplett, Tammy, PA-C  Phenyleph-Diphenhyd-DM-APAP (THERAFLU SEVERE COLD & COUGH) PACKET MISC Take 1 Package by mouth at bedtime as needed (cold and cough).    [provider]  promethazine (PHENERGAN) 25 MG tablet Take 1 tablet (25 mg total) by mouth every 6 (six) hours as needed for nausea or vomiting. 07/07/13   Triplett, Tammy, PA-C    Allergies    Patient has no known allergies.  Review of Systems   Review of Systems  Eyes: Negative for photophobia and visual disturbance.  Gastrointestinal: Negative for vomiting.  Musculoskeletal: Negative for neck pain.  Skin: Positive for wound.  Neurological: Positive for headaches. Negative for syncope.  Hematological: Does not bruise/bleed easily.  All other systems reviewed and are negative.   Physical Exam Updated Vital Signs BP 131/88 (BP Location: Right Arm)   Pulse 69   Temp 98.2 F (36.8 C) (Oral)   Resp 16   Ht 5\' 7"  (1.702 m)   Wt 72.6 kg   SpO2 99%   BMI 25.07 kg/m   Physical Exam Vitals and nursing note reviewed.  Constitutional:      Appearance: He is well-developed.  HENT:     Head: Normocephalic.     Comments: 2cm linear laceration to right occipital scalp with associated hematoma and TTP. No deformity. Wound is not grossly contaminated Eyes:     Extraocular  Movements: Extraocular movements intact.     Conjunctiva/sclera: Conjunctivae normal.     Pupils: Pupils are equal, round, and reactive to light.  Cardiovascular:     Rate and Rhythm: Normal rate and regular rhythm.  Pulmonary:     Effort: Pulmonary effort is normal.     Breath sounds: Normal breath sounds.  Abdominal:     Palpations: Abdomen is soft.  Musculoskeletal:     Cervical back: Normal range of motion and neck supple. No tenderness.     Comments: Left anterior mid shin with hematoma and small <1cm wound.   Skin:    General: Skin is warm.  Neurological:     Mental Status: He is alert.     Comments: CN grossly intact, PERRL, EOM  nl Normal strength and sensation to all 4 extremities. Normal tone, normal coordination with finger-to-nose  Psychiatric:        Behavior: Behavior normal.     ED Results / Procedures / Treatments   Labs (all labs ordered are listed, but only abnormal results are displayed) Labs Reviewed - No data to display  EKG None  Radiology DG Cervical Spine Complete  Result Date: 11/20/2019 CLINICAL DATA:  Fall off ladder 6-8 feet. EXAM: CERVICAL SPINE - COMPLETE 4+ VIEW COMPARISON:  None. FINDINGS: There is no evidence of cervical spine fracture or prevertebral soft tissue swelling. Alignment is normal. No other significant bone abnormalities are identified. IMPRESSION: Negative cervical spine radiographs. Electronically Signed   By: Norva Pavlov M.D.   On: 11/20/2019 10:38   DG Tibia/Fibula Left  Result Date: 11/20/2019 CLINICAL DATA:  Fall. Laceration. Fell off ladder 6-8 feet. Laceration with bleeding. EXAM: LEFT TIBIA AND FIBULA - 2 VIEW COMPARISON:  None. FINDINGS: There is no evidence of fracture or other focal bone lesions. Soft tissues are unremarkable. IMPRESSION: Negative. Electronically Signed   By: Norva Pavlov M.D.   On: 11/20/2019 10:35   CT Head Wo Contrast  Result Date: 11/20/2019 CLINICAL DATA:  Fall with right head injury on ladder today. EXAM: CT HEAD WITHOUT CONTRAST TECHNIQUE: Contiguous axial images were obtained from the base of the skull through the vertex without intravenous contrast. COMPARISON:  None. FINDINGS: Brain: No evidence of parenchymal hemorrhage or extra-axial fluid collection. No mass lesion, mass effect, or midline shift. No CT evidence of acute infarction. Cerebral volume is age appropriate. No ventriculomegaly. Vascular: No acute abnormality. Skull: No evidence of calvarial fracture. Sinuses/Orbits: The visualized paranasal sinuses are essentially clear. Other:  The mastoid air cells are unopacified. IMPRESSION: Negative head CT. No evidence of acute  intracranial abnormality. No evidence of calvarial fracture. Electronically Signed   By: Delbert Phenix M.D.   On: 11/20/2019 10:35    Procedures .Marland KitchenLaceration Repair  Date/Time: 11/20/2019 4:08 PM Performed by: Koty Anctil, [place] N, PA-C Authorized by: Nacole Fluhr, [place] N, PA-C   Consent:    Consent obtained:  Verbal   Consent given by:  Patient   Risks discussed:  Pain and infection Anesthesia (see MAR for exact dosages):    Anesthesia method:  Local infiltration   Local anesthetic:  Lidocaine 2% WITH epi Laceration details:    Location:  Scalp   Scalp location:  Occipital   Length (cm):  2 Repair type:    Repair type:  Simple Pre-procedure details:    Preparation:  Patient was prepped and draped in usual sterile fashion Exploration:    Hemostasis achieved with:  Direct pressure   Wound exploration: entire depth of wound probed and  visualized     Wound extent: no foreign bodies/material noted     Contaminated: no   Treatment:    Area cleansed with:  Saline   Amount of cleaning:  Standard   Visualized foreign bodies/material removed: no   Skin repair:    Repair method:  Staples   Number of staples:  4 Approximation:    Approximation:  Close Post-procedure details:    Dressing:  Open (no dressing)   Patient tolerance of procedure:  Tolerated well, no immediate complications   (including critical care time)  Medications Ordered in ED Medications  ibuprofen (ADVIL) tablet 600 mg (600 mg Oral Given 11/20/19 1201)  lidocaine-EPINEPHrine (XYLOCAINE W/EPI) 2 %-1:200000 (PF) injection 5 mL (5 mLs Infiltration Given 11/20/19 1200)    ED Course  I have reviewed the triage vital signs and the nursing notes.  Pertinent labs & imaging results that were available during my care of the patient were reviewed by me and considered in my medical decision making (see chart for details).    MDM Rules/Calculators/A&P                          Patient presenting with laceration to occipital  scalp after fall off of a ladder that occurred prior to arrival.  Patient states he was standing about 6 to 8 feet up on a ladder when the ladder slipped and slid down backwards.  The ladder landed under him however he fell towards his right side and struck the right occipital scalp on the ladder.  No LOC.  He has some mild headache localized to the wound, no vision changes, nausea, vomiting, neck pain or other neuro symptoms.  He also has contusion and small wound to the left shin that does not require repair.  No focal neuro deficits.  CT head is negative.  Plain films also ordered in triage are negative.  Tdap was updated last year per patient.  Wounds are irrigated, scalp wound closed with staples.  Discussed concussion precautions and wound care.  Discussed follow-up and return precautions.  Patient is well-appearing, agreeable to plan and appropriate for discharge.  Final Clinical Impression(s) / ED Diagnoses Final diagnoses:  Laceration of scalp, initial encounter  Contusion of left lower extremity, initial encounter  Minor head injury without loss of consciousness, initial encounter    Rx / DC Orders ED Discharge Orders    None       Brendon Christoffel, [place] N, PA-C 11/20/19 1610    Bethann Berkshire, MD 11/22/19 1026

## 2019-11-26 ENCOUNTER — Emergency Department (HOSPITAL_COMMUNITY)
Admission: EM | Admit: 2019-11-26 | Discharge: 2019-11-26 | Disposition: A | Discharge status: Home / Self Care | Payer: No Typology Code available for payment source | Attending: Emergency Medicine | Admitting: Emergency Medicine

## 2019-11-26 ENCOUNTER — Other Ambulatory Visit: Payer: Self-pay

## 2019-11-26 ENCOUNTER — Encounter (HOSPITAL_COMMUNITY): Payer: Self-pay | Admitting: Emergency Medicine

## 2019-11-26 DIAGNOSIS — S0101XD Laceration without foreign body of scalp, subsequent encounter: Secondary | ICD-10-CM | POA: Diagnosis not present

## 2019-11-26 DIAGNOSIS — Y939 Activity, unspecified: Secondary | ICD-10-CM | POA: Diagnosis not present

## 2019-11-26 DIAGNOSIS — Y999 Unspecified external cause status: Secondary | ICD-10-CM | POA: Insufficient documentation

## 2019-11-26 DIAGNOSIS — Z79899 Other long term (current) drug therapy: Secondary | ICD-10-CM | POA: Diagnosis not present

## 2019-11-26 DIAGNOSIS — Z4802 Encounter for removal of sutures: Secondary | ICD-10-CM

## 2019-11-26 DIAGNOSIS — X58XXXD Exposure to other specified factors, subsequent encounter: Secondary | ICD-10-CM | POA: Insufficient documentation

## 2019-11-26 DIAGNOSIS — Y929 Unspecified place or not applicable: Secondary | ICD-10-CM | POA: Insufficient documentation

## 2019-11-26 DIAGNOSIS — S0990XD Unspecified injury of head, subsequent encounter: Secondary | ICD-10-CM | POA: Diagnosis present

## 2019-11-26 NOTE — Discharge Instructions (Addendum)
Continue to clean your wound with mild soap and water.  Your wound appears to be healing well.  Follow-up with your primary doctor for recheck, return to emergency department for any worsening symptoms.

## 2019-11-26 NOTE — ED Triage Notes (Signed)
Pt need staples removed from head

## 2019-11-26 NOTE — ED Provider Notes (Signed)
[facility] EMERGENCY DEPARTMENT Provider Note   CSN: [id number] Arrival date & time: 11/26/19  1315     History Chief Complaint  Patient presents with  . Suture / Staple Removal    Travis Walker is a 34 y.o. male.  HPI      Travis Walker is a 34 y.o. male who presents to the Emergency Department requesting staple removal.  He was seen here on 11/20/2019 after a laceration to his right posterior scalp.  4 staples were applied.  He denies complications, pain, swelling or drainage.  No fever or chills.   Past Medical History:  Diagnosis Date  . Headache(784.0)    migraines  . Pneumonia 2004    There are no problems to display for this patient.   Past Surgical History:  Procedure Laterality Date  . HERNIA REPAIR    . UMBILICAL HERNIA REPAIR  04/17/2011   Procedure: HERNIA REPAIR UMBILICAL ADULT;  Surgeon: Atilano Ina, MD;  Location: [facility] OR;  Service: General;  Laterality: N/A;       History reviewed. No pertinent family history.  Social History   Tobacco Use  . Smoking status: Never Smoker  . Smokeless tobacco: Never Used  Substance Use Topics  . Alcohol use: No    Comment: occassional  . Drug use: No    Home Medications Prior to Admission medications   Medication Sig Start Date End Date Taking? Authorizing Provider  acetaminophen (TYLENOL) 500 MG tablet Take 1,000 mg by mouth every 6 (six) hours as needed for fever.    [provider]  Chlorphen-Phenyleph-ASA (ALKA-SELTZER PLUS COLD PO) Take 2 capsules by mouth every 6 (six) hours as needed (cold/cough).    [provider]  famotidine (PEPCID) 20 MG tablet Take 1 tablet (20 mg total) by mouth 2 (two) times daily. 07/07/13   Tahlia Deamer, PA-C  Phenyleph-Diphenhyd-DM-APAP (THERAFLU SEVERE COLD & COUGH) PACKET MISC Take 1 Package by mouth at bedtime as needed (cold and cough).    [provider]  promethazine (PHENERGAN) 25 MG tablet Take 1 tablet (25 mg total) by  mouth every 6 (six) hours as needed for nausea or vomiting. 07/07/13   Paschal Blanton, PA-C    Allergies    Patient has no known allergies.  Review of Systems   Review of Systems  Constitutional: Negative for chills and fever.  Musculoskeletal: Negative for arthralgias, back pain, joint swelling and neck pain.  Skin: Positive for wound. Negative for color change.       Laceration to right posterior scalp with 4 staples present.  Neurological: Negative for dizziness, weakness, numbness and headaches.  Hematological: Does not bruise/bleed easily.    Physical Exam Updated Vital Signs Pulse 76   Temp 98.2 F (36.8 C) (Oral)   Resp 17   Ht 5\' 7"  (1.702 m)   Wt 72.6 kg   SpO2 98%   BMI 25.06 kg/m   Physical Exam Vitals and nursing note reviewed.  Constitutional:      Appearance: Normal appearance. He is not ill-appearing.  HENT:     Head:     Comments: Laceration to right posterior scalp.  4 staples present.  No erythema, drainage, or edema.  Laceration appears to be healing well. Cardiovascular:     Rate and Rhythm: Normal rate and regular rhythm.  Pulmonary:     Effort: Pulmonary effort is normal.     Breath sounds: Normal breath sounds.  Chest:     Chest wall: No  tenderness.  Abdominal:     Palpations: Abdomen is soft.     Tenderness: There is no abdominal tenderness.  Musculoskeletal:     Cervical back: Normal range of motion. No tenderness.  Skin:    General: Skin is warm.  Neurological:     Mental Status: He is alert.     ED Results / Procedures / Treatments   Labs (all labs ordered are listed, but only abnormal results are displayed) Labs Reviewed - No data to display  EKG None  Radiology No results found.  Procedures Procedures (including critical care time)  SUTURE REMOVAL Performed by: Johneisha Broaden  Consent: Verbal consent obtained. Patient identity confirmed: provided demographic data Time out: Immediately prior to procedure a "time out"  was called to verify the correct patient, procedure, equipment, support staff and site/side marked as required.  Location details: right posterior scalp  Wound Appearance: clean  Sutures/Staples Removed: 4  Facility: sutures placed in this facility Patient tolerance: Patient tolerated the procedure well with no immediate complications.  Staples removed by me without complication.  No wound dehiscence   Medications Ordered in ED Medications - No data to display  ED Course  I have reviewed the triage vital signs and the nursing notes.  Pertinent labs & imaging results that were available during my care of the patient were reviewed by me and considered in my medical decision making (see chart for details).    MDM Rules/Calculators/A&P                          Patient here requesting staple removal.  Wound appears to be healing well.  Patient agrees to continue wound care instructions as discussed.  He appears appropriate for discharge home.   Final Clinical Impression(s) / ED Diagnoses Final diagnoses:  Encounter for staple removal    Rx / DC Orders ED Discharge Orders    None       Pauline Aus, PA-C 11/26/19 1623    Derwood Kaplan, MD 11/26/19 1952

## 2021-01-13 IMAGING — CT CT HEAD W/O CM
3 series · 16 of 47 positions shown, 19 images · non-contrast
Comparison: None.

CLINICAL DATA: Fall with right head injury on ladder today.

EXAM:
CT HEAD WITHOUT CONTRAST
TECHNIQUE: Contiguous axial images were obtained from the base of the skull
through the vertex without intravenous contrast.

[Series 2: head w o · axial · 0.40mm/px · z∈[-61,+84]mm · 10 of 35 slices shown, 13 images]
[im 3/35  brain]
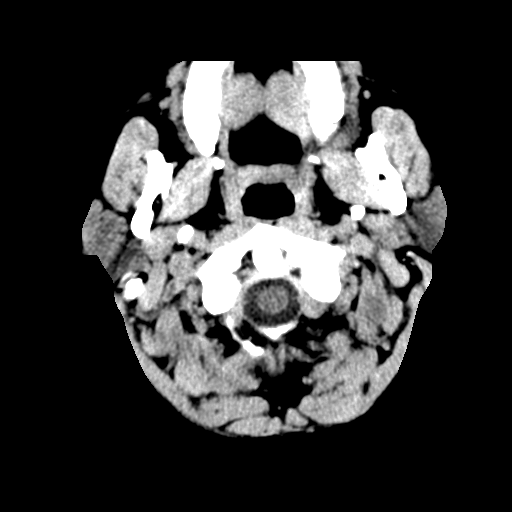
[im 3/35  bone]
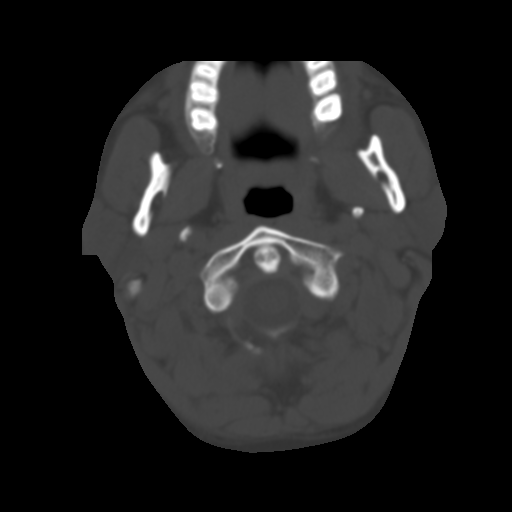
[im 6/35  brain]
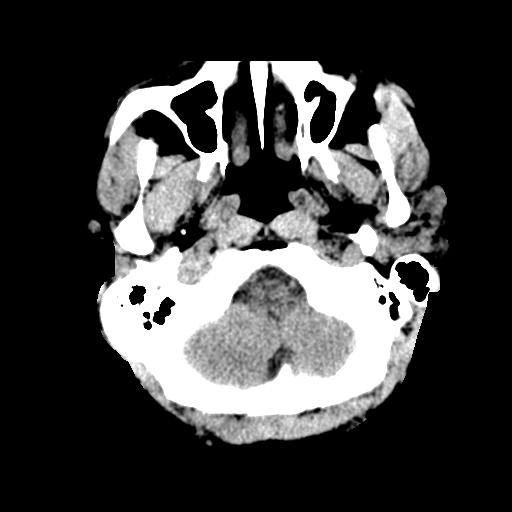
[im 10/35  brain]
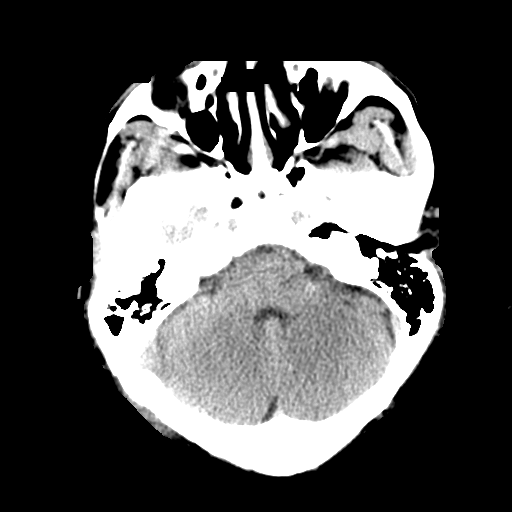
[im 12/35  brain]
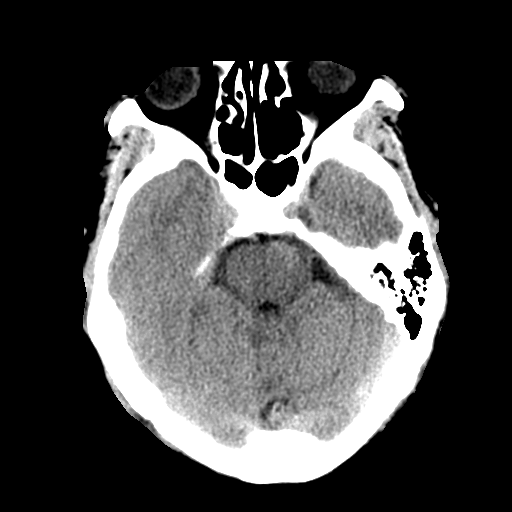
[im 16/35  brain]
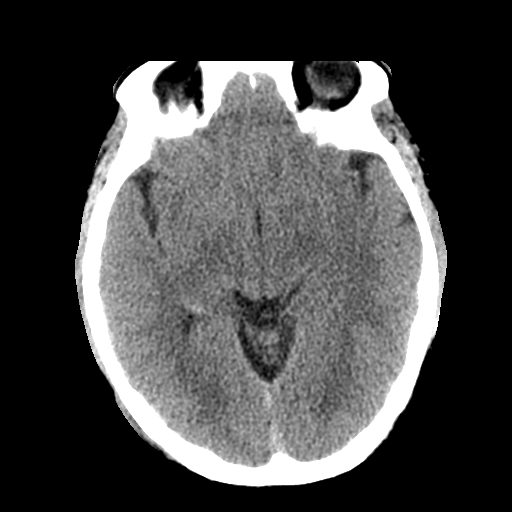
[im 16/35  bone]
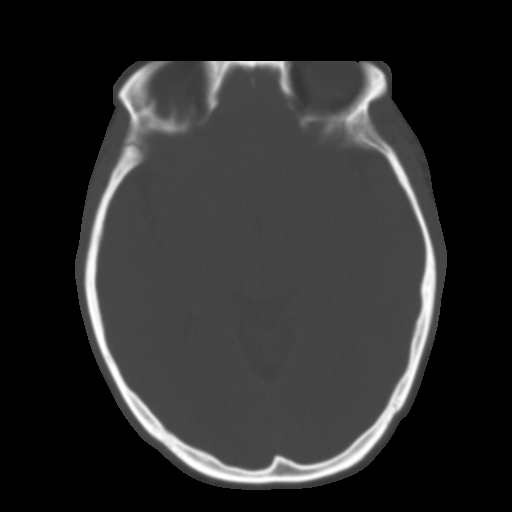
[im 19/35  brain]
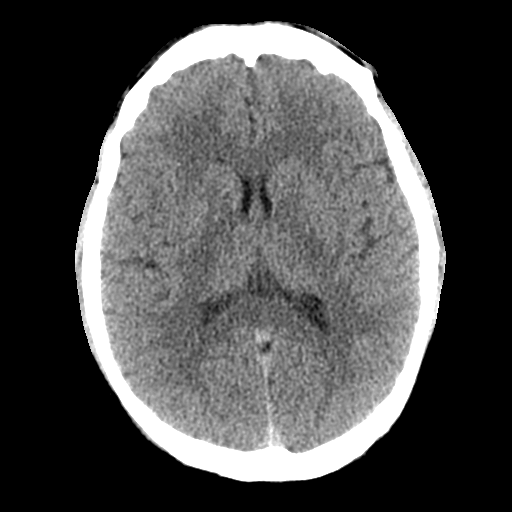
[im 23/35  brain]
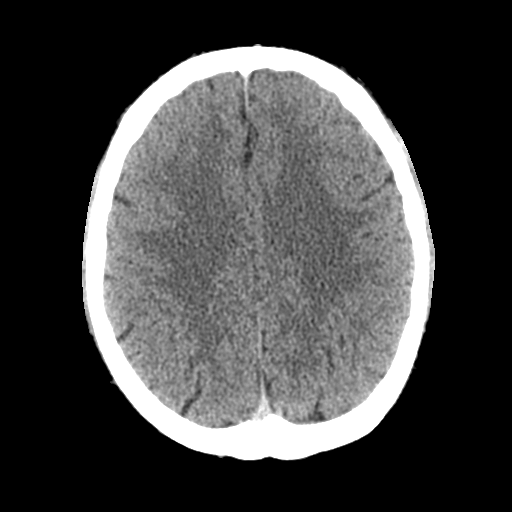
[im 26/35  brain]
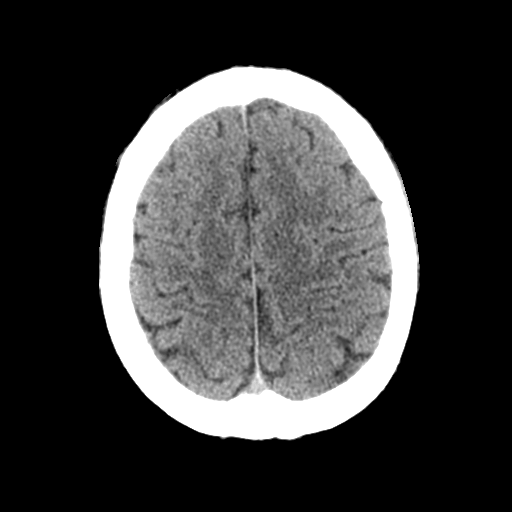
[im 29/35  brain]
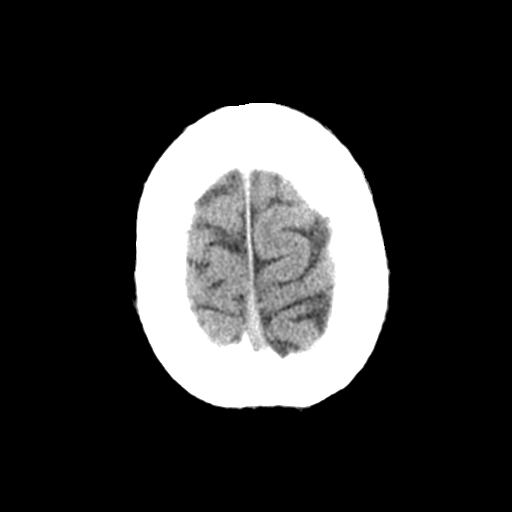
[im 29/35  bone]
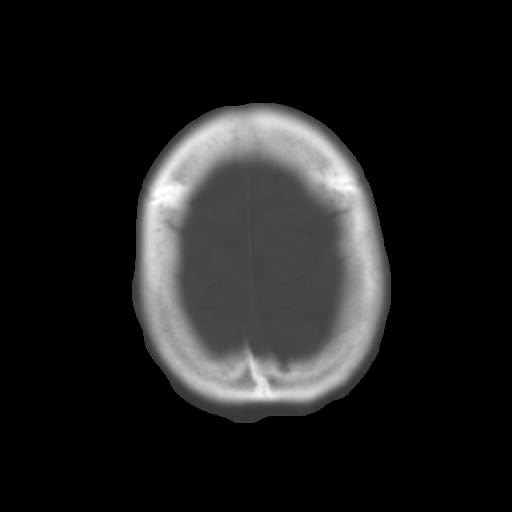
[im 32/35  brain]
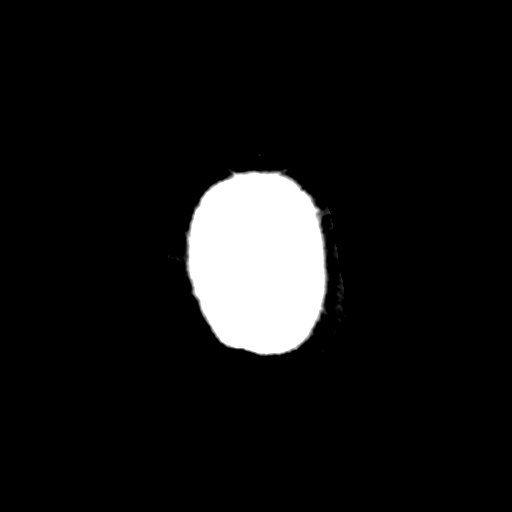

[Series 4: coronal soft · coronal · 0.36mm/px · 3 of 67 slices shown]
[im 23/67  brain]
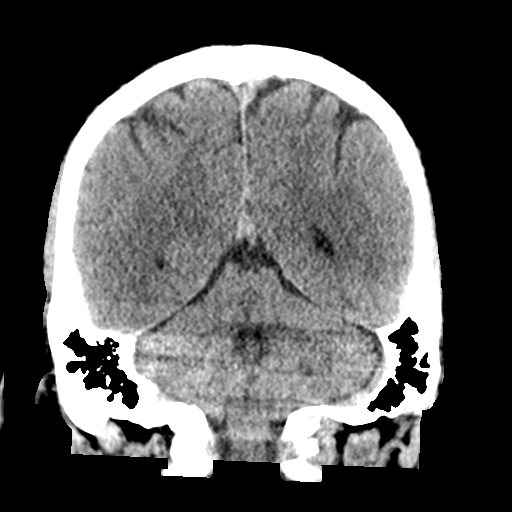
[im 30/67  brain]
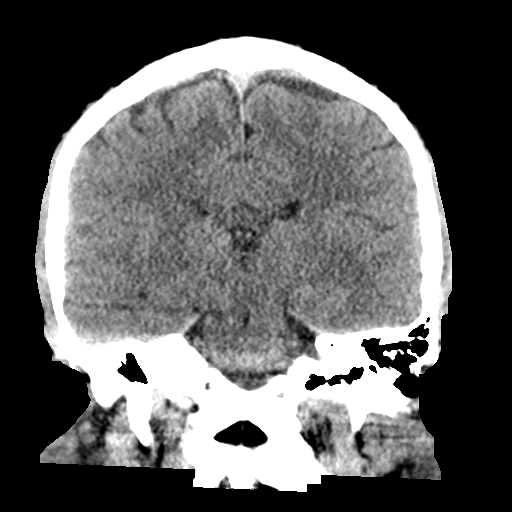
[im 37/67  brain]
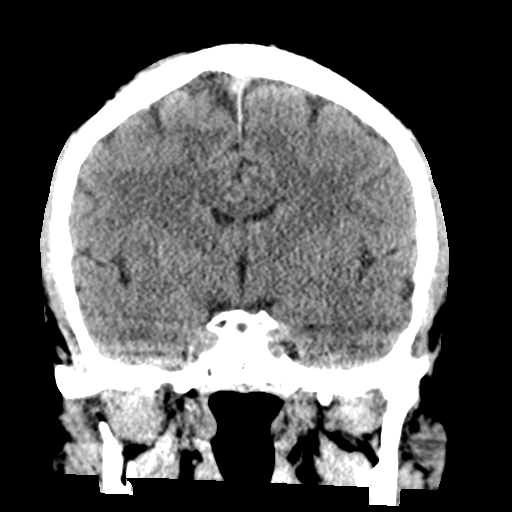

[Series 5: sagittal soft · sagittal · 0.37mm/px · 3 of 58 slices shown]
[im 20/58  brain]
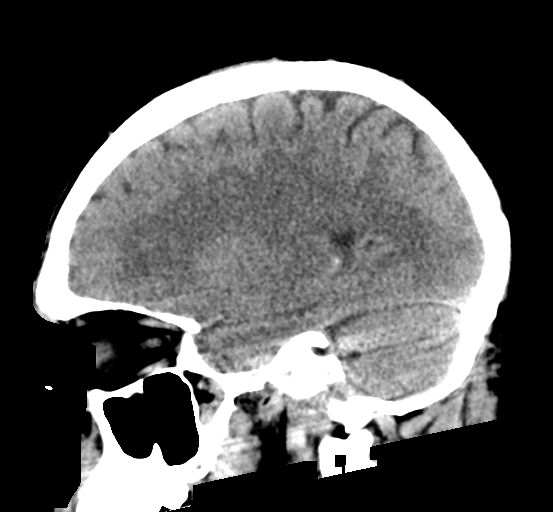
[im 29/58  brain]
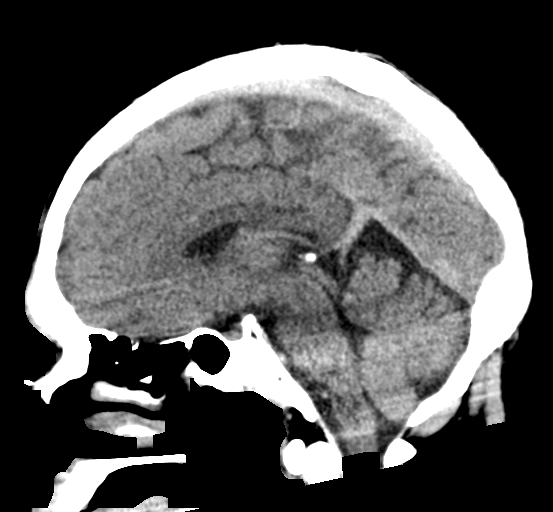
[im 39/58  brain]
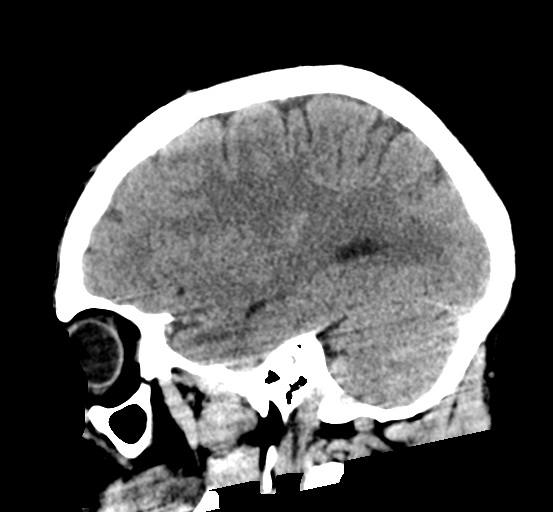

[16 of 47 positions shown; findings below may reference images not displayed]

FINDINGS: Brain: No evidence of parenchymal hemorrhage or extra-axial fluid
collection. No mass lesion, mass effect, or midline shift. No CT
evidence of acute infarction. Cerebral volume is age appropriate. No
ventriculomegaly.

Vascular: No acute abnormality.

Skull: No evidence of calvarial fracture.

Sinuses/Orbits: The visualized paranasal sinuses are essentially
clear.

Other:  The mastoid air cells are unopacified.
IMPRESSION: Negative head CT. No evidence of acute intracranial abnormality. No
evidence of calvarial fracture.

## 2021-12-12 NOTE — Progress Notes (Signed)
Travis Walker, [place] [id number]   CLINIC:  Medical Oncology/Hematology  CONSULT NOTE  Patient Care Team: Robyn Haber, MD as PCP - General (Family Medicine)  CHIEF COMPLAINTS/PURPOSE OF CONSULTATION:  Thrombocytopenia  HISTORY OF PRESENTING ILLNESS:  Travis Walker 36 y.o. male is here at the request of Delman Cheadle, PA-C [facility]) for evaluation of thrombocytopenia.  Limited prior laboratory data is available.   Mild thrombocytopenia in December 2012/January 2013 with platelets 125-146 Episode of severe thrombocytopenia (platelets in the 30s) in March/April 2015 as described below. Normal platelets 321 on 07/21/2013 Labs from PCP (11/10/2021)  with mild thrombocytopenia (platelets 131), but normal hemoglobin and white blood cells.  Per CareEverywhere records, patient was hospitalized at [facility] from 07/08/2013 through 07/15/2013 for unspecified infectious syndrome consisting of fever, splenomegaly, thrombocytopenia, and leukopenia.  Lowest blood counts occurred on 07/08/2013 with platelets 32, WBC 1.7, ANC 0.9, ALC 0.5.  Infectious disease work-up at the time was nonrevealing (NEGATIVE for HIV, hepatitis, EBV, CMV, parvovirus, fungal cultures, and tickborne illness).  CT abdomen/pelvis from 07/13/2013 showed massive splenomegaly measuring up to 20 cm.  (Prior CT abdomen/pelvis in 2013 showed normal spleen.)  Patient had bone marrow biopsy on 07/14/2013 which was negative for any signs of malignancy.  Incidental finding on CT chest (07/13/2013) with 4 mm nodule in left lower lobe of lung.  Platelets had improved at the time of hospital discharge.  Patient was scheduled for follow-up with [facility] hematology as well as follow-up CT chest for monitoring of lung nodule.  However, these were never obtained and patient was lost to follow-up.    He denies any known nutritional deficiencies, but takes nonspecified B vitamin as well as zinc and  vitamin C.  No history of autoimmune or connective tissue disease.  No history of liver disease.  No known history of hepatitis, HIV, or H. pylori.  He does not have any history of blood transfusions.  He denies any recent or recurrent infections.  He does not take any medications at home.  He does not drink tonic water, eat excessive walnuts, or take any herbal supplements.  He drinks approximately 3-4 beers throughout the weekend.  Regarding symptoms of thrombocytopenia, he reports occasional scant rectal bleeding presumably from hemorrhoids.  He denies any abnormal bruising or petechial rash.  No B symptoms such as fever, chills, night sweats, unintentional weight loss.  He has not noticed any new lumps or bumps.  He does complain of some headaches and fatigue for the past 3 weeks.  Patient denies any chronic medical conditions.  Patient was raised in [place], but has been in [place] since 2006.  He lives at home alone.  He works for a [employer].  He is a lifelong non-smoker.  Denies any illicit drug use.  Drinks approximately 3-4 beers each weekend.  Patient's paternal grandmother had brain cancer.  Maternal grandmother and grandfather both had unspecified cancer.   MEDICAL HISTORY:  Past Medical History:  Diagnosis Date   Headache(784.0)    migraines   Pneumonia 2004    SURGICAL HISTORY: Past Surgical History:  Procedure Laterality Date   HERNIA REPAIR     UMBILICAL HERNIA REPAIR  04/17/2011   Procedure: HERNIA REPAIR UMBILICAL ADULT;  Surgeon: Gayland Curry, MD;  Location: [facility];  Service: General;  Laterality: N/A;    SOCIAL HISTORY: Social History   Socioeconomic History   Marital status: Married    Spouse  name: Not on file   Number of children: Not on file   Years of education: Not on file   Highest education level: Not on file  Occupational History   Not on file  Tobacco Use   Smoking status: Never   Smokeless tobacco: Never  Substance and Sexual  Activity   Alcohol use: No    Comment: occassional   Drug use: No   Sexual activity: Yes  Other Topics Concern   Not on file  Social History Narrative   Not on file   Social Determinants of Health   Financial Resource Strain: Not on file  Food Insecurity: Not on file  Transportation Needs: Not on file  Physical Activity: Not on file  Stress: Not on file  Social Connections: Not on file  Intimate Partner Violence: Not on file    FAMILY HISTORY: No family history on file.  ALLERGIES:  has No Known Allergies.  MEDICATIONS:  Current Outpatient Medications  Medication Sig Dispense Refill   acetaminophen (TYLENOL) 500 MG tablet Take 1,000 mg by mouth every 6 (six) hours as needed for fever.     Chlorphen-Phenyleph-ASA (ALKA-SELTZER PLUS COLD PO) Take 2 capsules by mouth every 6 (six) hours as needed (cold/cough).     famotidine (PEPCID) 20 MG tablet Take 1 tablet (20 mg total) by mouth 2 (two) times daily. 30 tablet 0   Phenyleph-Diphenhyd-DM-APAP (THERAFLU SEVERE COLD & COUGH) PACKET MISC Take 1 Package by mouth at bedtime as needed (cold and cough).     promethazine (PHENERGAN) 25 MG tablet Take 1 tablet (25 mg total) by mouth every 6 (six) hours as needed for nausea or vomiting. 10 tablet 0   No current facility-administered medications for this visit.    REVIEW OF SYSTEMS:     Review of Systems  Constitutional:  Positive for fatigue. Negative for appetite change, chills, diaphoresis, fever and unexpected weight change.  HENT:   Negative for lump/mass and nosebleeds.   Eyes:  Negative for eye problems.  Respiratory:  Negative for cough, hemoptysis and shortness of breath.   Cardiovascular:  Negative for chest pain, leg swelling and palpitations.  Gastrointestinal:  Positive for diarrhea and nausea. Negative for abdominal pain, blood in stool, constipation and vomiting.  Genitourinary:  Negative for hematuria.   Skin: Negative.   Neurological:  Positive for headaches.  Negative for dizziness and light-headedness.  Hematological:  Does not bruise/bleed easily.      PHYSICAL EXAMINATION:   ECOG PERFORMANCE STATUS: 0 - Asymptomatic  There were no vitals filed for this visit. There were no vitals filed for this visit.  Physical Exam Constitutional:      Appearance: Normal appearance.  HENT:     Head: Normocephalic and atraumatic.     Mouth/Throat:     Mouth: Mucous membranes are moist.  Eyes:     Extraocular Movements: Extraocular movements intact.     Pupils: Pupils are equal, round, and reactive to light.  Cardiovascular:     Rate and Rhythm: Normal rate and regular rhythm.     Pulses: Normal pulses.     Heart sounds: Normal heart sounds.  Pulmonary:     Effort: Pulmonary effort is normal.     Breath sounds: Normal breath sounds.  Abdominal:     General: Bowel sounds are normal.     Palpations: Abdomen is soft.     Tenderness: There is no abdominal tenderness.  Musculoskeletal:        General: No swelling.  Right lower leg: No edema.     Left lower leg: No edema.  Lymphadenopathy:     Cervical: No cervical adenopathy.  Skin:    General: Skin is warm and dry.  Neurological:     General: No focal deficit present.     Mental Status: He is alert and oriented to person, place, and time.  Psychiatric:        Mood and Affect: Mood normal.        Behavior: Behavior normal.       LABORATORY DATA:  I have reviewed the data as listed No results found for this or any previous visit (from the past 2160 hour(s)).  RADIOGRAPHIC STUDIES: I have personally reviewed the radiological images as listed and agreed with the findings in the report. No results found.  ASSESSMENT & PLAN: 1.  Thrombocytopenia - Seen at the request of Delman Cheadle, PA-C [facility]) for evaluation of thrombocytopenia. - Mild thrombocytopenia since at least 2012, but with limited prior laboratory data - Episode of severe thrombocytopenia and  leukopenia during hospitalization in March/April 2015 for unspecified infectious system consisting of fever, splenomegaly, transaminitis, thrombocytopenia, leukopenia. CBC (07/08/2013): Platelets 32, WBC 1.7, ANC 0.9, ALC 0.5 Infectious work-up nondiagnostic: NEGATIVE for HIV, hepatitis, EBV, CMV, parvovirus, fungal culture, tickborne illness CT abdomen/pelvis (07/13/2013): Massive splenomegaly measuring up to 20 cm but with normal-appearing liver (prior CT abdomen/pelvis in 2013 showed normal spleen) Bone marrow biopsy (07/14/2013) negative for any signs of malignancy Referred to outpatient hematology at discharge, but never followed up with them per available records. - CBC sent from PCP (10/31/2021) showed mild thrombocytopenia with platelets 131, but normal Hgb and WBC -No known nutritional deficiency, autoimmune/connective tissue disease, liver disease, hepatitis, HIV, or H. pylori. - No prior blood transfusions. - No recent infections. - No home medications or herbal supplements. - Drinks 3-4 beers each weekend. - No abnormal bruising or petechial rash.  Occasional scant rectal bleeding from hemorrhoids. - No B symptoms or new lumps or bumps. -- No lymphadenopathy or hepatosplenomegaly noted on exam - PLAN: We will check labs today to determine etiology of thrombocytopenia.  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  2.  Lung nodule - Incidental finding during hospitalization at [facility] in March/April 2015 - CT chest (07/13/2013): 4 mm nodule in left lower lobe of lung - Patient was referred for follow-up CT scan at [facility], but never received follow-up imaging per available records and patient report  - PLAN: After this length of time and with negative smoking history, no imaging follow-up planned at this time per Dr. Delton Coombes  3.  Other history -PMH: Denies any chronic medical conditions or home medications. - SOCIAL: Patient was raised in [place], but has been in [place]  since 2006.  He lives at home alone.  He works for a [employer].  He is a lifelong non-smoker.  Denies any illicit drug use.  Drinks approximately 3-4 beers each weekend. - FAMILY: Patient's paternal grandmother had brain cancer.  Maternal grandmother and grandfather both had unspecified cancer.   PLAN SUMMARY & DISPOSITION: Labs today Abdominal ultrasound to follow-up on previous history of splenomegaly Office visit in 2 to 4 weeks to discuss results  All questions were answered. The patient knows to call the clinic with any problems, questions or concerns.   Medical decision making: Moderate  Time spent on visit: I spent 30 minutes counseling the patient face to face. The total time spent in the  appointment was 55 minutes and more than 50% was on counseling.  I, Tarri Abernethy PA-C, have seen this patient in conjunction with Dr. Derek Jack.  Greater than 50% of visit was performed by Dr. Delton Coombes.   Harriett Rush, PA-C 12/13/2021 1:01 PM  DR. Kiyoshi Schaab: I have independently evaluated this patient and formulated my assessment and plan.  I agree with HPI, assessment and plan written by Casey Burkitt, PA-C.  Patient being evaluated for mild thrombocytopenia.  No bleeding issues reported.  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  However will check spleen imaging as he had history of enlarged spleen when he was sick in 2015.  We will also check for other nutritional deficiencies.  RTC 2 weeks to discuss results.  NOTE: Patient speaks very good [place], but does require some assistance.  Certified Spanish language medical interpreter was present during exam to assist with accuracy of visit.

## 2021-12-13 ENCOUNTER — Encounter: Payer: Self-pay | Admitting: Hematology

## 2021-12-13 ENCOUNTER — Inpatient Hospital Stay: Payer: PRIVATE HEALTH INSURANCE | Attending: Hematology | Admitting: Hematology

## 2021-12-13 ENCOUNTER — Inpatient Hospital Stay: Payer: PRIVATE HEALTH INSURANCE

## 2021-12-13 VITALS — BP 120/79 | HR 82 | Temp 97.1°F | Resp 18 | Ht 67.0 in | Wt 189.6 lb

## 2021-12-13 DIAGNOSIS — Z809 Family history of malignant neoplasm, unspecified: Secondary | ICD-10-CM | POA: Diagnosis not present

## 2021-12-13 DIAGNOSIS — R911 Solitary pulmonary nodule: Secondary | ICD-10-CM | POA: Insufficient documentation

## 2021-12-13 DIAGNOSIS — D696 Thrombocytopenia, unspecified: Secondary | ICD-10-CM | POA: Insufficient documentation

## 2021-12-13 DIAGNOSIS — R161 Splenomegaly, not elsewhere classified: Secondary | ICD-10-CM

## 2021-12-13 DIAGNOSIS — K625 Hemorrhage of anus and rectum: Secondary | ICD-10-CM | POA: Insufficient documentation

## 2021-12-13 LAB — HEPATITIS B SURFACE ANTIBODY,QUALITATIVE: Hep B S Ab: NONREACTIVE

## 2021-12-13 LAB — CBC WITH DIFFERENTIAL/PLATELET
Abs Immature Granulocytes: 0 10*3/uL (ref 0.00–0.07)
Basophils Absolute: 0.1 10*3/uL (ref 0.0–0.1)
Basophils Relative: 1 %
Eosinophils Absolute: 0.3 10*3/uL (ref 0.0–0.5)
Eosinophils Relative: 5 %
HCT: 46.4 % (ref 39.0–52.0)
Hemoglobin: 15.8 g/dL (ref 13.0–17.0)
Immature Granulocytes: 0 %
Lymphocytes Relative: 34 %
Lymphs Abs: 1.9 10*3/uL (ref 0.7–4.0)
MCH: 31.3 pg (ref 26.0–34.0)
MCHC: 34.1 g/dL (ref 30.0–36.0)
MCV: 92.1 fL (ref 80.0–100.0)
Monocytes Absolute: 0.4 10*3/uL (ref 0.1–1.0)
Monocytes Relative: 7 %
Neutro Abs: 3 10*3/uL (ref 1.7–7.7)
Neutrophils Relative %: 53 %
Platelets: 135 10*3/uL — ABNORMAL LOW (ref 150–400)
RBC: 5.04 MIL/uL (ref 4.22–5.81)
RDW: 13.2 % (ref 11.5–15.5)
WBC: 5.5 10*3/uL (ref 4.0–10.5)
nRBC: 0 % (ref 0.0–0.2)

## 2021-12-13 LAB — VITAMIN B12: Vitamin B-12: 275 pg/mL (ref 180–914)

## 2021-12-13 LAB — IRON AND TIBC
Iron: 137 ug/dL (ref 45–182)
Saturation Ratios: 37 % (ref 17.9–39.5)
TIBC: 370 ug/dL (ref 250–450)
UIBC: 233 ug/dL

## 2021-12-13 LAB — HEPATITIS B SURFACE ANTIGEN: Hepatitis B Surface Ag: NONREACTIVE

## 2021-12-13 LAB — FOLATE: Folate: 12.7 ng/mL (ref >5.9)

## 2021-12-13 LAB — HEPATITIS B CORE ANTIBODY, TOTAL: Hep B Core Total Ab: NONREACTIVE

## 2021-12-13 LAB — FERRITIN: Ferritin: 97 ng/mL (ref 24–336)

## 2021-12-13 LAB — HIV ANTIBODY (ROUTINE TESTING W REFLEX): HIV Screen 4th Generation wRfx: REACTIVE — AB

## 2021-12-13 NOTE — Patient Instructions (Signed)
[facility] **VISIT SUMMARY & IMPORTANT INSTRUCTIONS **   You were seen today by Dr. Ellin Saba & Rojelio Brenner PA-C for your low platelets.   We will check labs today to help [place] determine why your platelets are low. We will also check an abdominal ultrasound to look at your liver and spleen.  FOLLOW-UP APPOINTMENT: Office visit in about 1 month (after testing is completed) to discuss results and next steps  ** Thank you for trusting me with your healthcare!  I strive to provide all of my patients with quality care at each visit.  If you receive a survey for this visit, I would be so grateful to you for taking the time to provide feedback.  Thank you in advance!  ~ Maia Handa                   Dr. Doreatha Massed   &   Rojelio Brenner, PA-C   - - - - - - - - - - - - - - - - - -    Thank you for choosing  [facility] Center at [facility] to provide your oncology and hematology care.  To afford each patient quality time with our provider, please arrive at least 15 minutes before your scheduled appointment time.   If you have a lab appointment with the Cancer Center please come in thru the Main Entrance and check in at the main information desk.  You need to re-schedule your appointment should you arrive 10 or more minutes late.  We strive to give you quality time with our providers, and arriving late affects you and other patients whose appointments are after yours.  Also, if you no show three or more times for appointments you may be dismissed from the clinic at the providers discretion.     Again, thank you for choosing [facility].  Our hope is that these requests will decrease the amount of time that you wait before being seen by our physicians.       _____________________________________________________________  Should you have questions after your visit to [facility], please contact our office at [phone] and follow the prompts.  Our office hours are 8:00 a.m. and 4:30 p.m. Monday - Friday.  Please note that voicemails left after 4:00 p.m. may not be returned until the following business day.  We are closed weekends and major holidays.  You do have access to a nurse 24-7, just call the main number to the clinic [phone] and do not press any options, hold on the line and a nurse will answer the phone.    For prescription refill requests, have your pharmacy contact our office and allow 72 hours.

## 2021-12-14 LAB — ANA: Anti Nuclear Antibody (ANA): POSITIVE — AB

## 2021-12-14 LAB — RHEUMATOID FACTOR: Rheumatoid fact SerPl-aCnc: <10 IU/mL (ref <14.0)

## 2021-12-15 LAB — HCV AB W REFLEX TO QUANT PCR: HCV Ab: NONREACTIVE

## 2021-12-15 LAB — HCV INTERPRETATION

## 2021-12-17 LAB — KAPPA/LAMBDA LIGHT CHAINS
Kappa free light chain: 16.2 mg/L (ref 3.3–19.4)
Kappa, lambda light chain ratio: 1.31 (ref 0.26–1.65)
Lambda free light chains: 12.4 mg/L (ref 5.7–26.3)

## 2021-12-17 LAB — H PYLORI, IGM, IGG, IGA AB
H Pylori IgG: 0.34 Index Value (ref 0.00–0.79)
H. Pylogi, Iga Abs: <9 units (ref 0.0–8.9)
H. Pylogi, Igm Abs: <9 units (ref 0.0–8.9)

## 2021-12-17 LAB — METHYLMALONIC ACID, SERUM: Methylmalonic Acid, Quantitative: 135 nmol/L (ref 0–378)

## 2021-12-18 LAB — PROTEIN ELECTROPHORESIS, SERUM
A/G Ratio: 1.4 (ref 0.7–1.7)
Albumin ELP: 4 g/dL (ref 2.9–4.4)
Alpha-1-Globulin: 0.2 g/dL (ref 0.0–0.4)
Alpha-2-Globulin: 0.6 g/dL (ref 0.4–1.0)
Beta Globulin: 0.9 g/dL (ref 0.7–1.3)
Gamma Globulin: 1.2 g/dL (ref 0.4–1.8)
Globulin, Total: 2.9 g/dL (ref 2.2–3.9)
Total Protein ELP: 6.9 g/dL (ref 6.0–8.5)

## 2021-12-20 LAB — COPPER, SERUM: Copper: 103 ug/dL (ref 69–132)

## 2021-12-23 ENCOUNTER — Ambulatory Visit (HOSPITAL_COMMUNITY)
Admission: RE | Admit: 2021-12-23 | Discharge: 2021-12-23 | Disposition: A | Discharge status: Home / Self Care | Payer: No Typology Code available for payment source | Source: Ambulatory Visit | Attending: Physician Assistant | Admitting: Physician Assistant

## 2021-12-23 DIAGNOSIS — D696 Thrombocytopenia, unspecified: Secondary | ICD-10-CM | POA: Diagnosis present

## 2021-12-23 DIAGNOSIS — R161 Splenomegaly, not elsewhere classified: Secondary | ICD-10-CM | POA: Diagnosis present

## 2021-12-23 LAB — IMMUNOFIXATION ELECTROPHORESIS
IgA: 109 mg/dL (ref 90–386)
IgG (Immunoglobin G), Serum: 1285 mg/dL (ref 603–1613)
IgM (Immunoglobulin M), Srm: 61 mg/dL (ref 20–172)
Total Protein ELP: 7 g/dL (ref 6.0–8.5)

## 2021-12-25 LAB — HIV-1/2 AB - DIFFERENTIATION
HIV 1 Ab: NONREACTIVE
HIV 2 Ab: NONREACTIVE
Note: NEGATIVE

## 2021-12-25 LAB — HIV-1/HIV-2 QUALITATIVE RNA
Final Interpretation: NEGATIVE
HIV-1 RNA, Qualitative: NONREACTIVE
HIV-2 RNA, Qualitative: NONREACTIVE

## 2022-01-06 NOTE — Progress Notes (Unsigned)
[address], [place] [address]   CLINIC:  Medical Oncology/Hematology  PCP:  Robyn Haber, MD No address on file None   REASON FOR VISIT:  Follow-up for thrombocytopenia  PRIOR THERAPY: None  CURRENT THERAPY: Under work-up  INTERVAL HISTORY:  Travis Walker 36 y.o. male returns for routine follow-up of thrombocytopenia.  He was seen for initial consultation by Dr. Delton Coombes and Tarri Abernethy PA-C on 12/13/2021.  At today's visit, he reports feeling fairly well.  He has not had any changes in his health since his initial visit last month.  He has occasional scant rectal bleeding presumably from hemorrhoids.  He denies any abnormal bruising or petechial rash.  No B symptoms such as fever, chills, night sweats, unintentional weight loss.  He has not noticed any new lumps or bumps.  He has 70% energy and 70% appetite. He endorses that he is maintaining a stable weight.   REVIEW OF SYSTEMS:  Review of Systems  Constitutional:  Positive for fatigue. Negative for appetite change, chills, diaphoresis, fever and unexpected weight change.  HENT:   Negative for lump/mass and nosebleeds.   Eyes:  Negative for eye problems.  Respiratory:  Negative for cough, hemoptysis and shortness of breath.   Cardiovascular:  Negative for chest pain, leg swelling and palpitations.  Gastrointestinal:  Positive for diarrhea and nausea. Negative for abdominal pain, blood in stool, constipation and vomiting.  Genitourinary:  Negative for hematuria.   Skin: Negative.   Neurological:  Positive for dizziness and numbness. Negative for headaches and light-headedness.  Hematological:  Does not bruise/bleed easily.      PAST MEDICAL/SURGICAL HISTORY:  Past Medical History:  Diagnosis Date   Headache(784.0)    migraines   Pneumonia 2004   Past Surgical History:  Procedure Laterality Date   HERNIA REPAIR     UMBILICAL HERNIA REPAIR  04/17/2011   Procedure:  HERNIA REPAIR UMBILICAL ADULT;  Surgeon: Gayland Curry, MD;  Location: [facility];  Service: General;  Laterality: N/A;     SOCIAL HISTORY:  Social History   Socioeconomic History   Marital status: Married    Spouse name: Not on file   Number of children: Not on file   Years of education: Not on file   Highest education level: Not on file  Occupational History   Not on file  Tobacco Use   Smoking status: Never   Smokeless tobacco: Never  Substance and Sexual Activity   Alcohol use: No    Comment: occassional   Drug use: No   Sexual activity: Yes  Other Topics Concern   Not on file  Social History Narrative   Not on file   Social Determinants of Health   Financial Resource Strain: Not on file  Food Insecurity: Not on file  Transportation Needs: Not on file  Physical Activity: Not on file  Stress: Not on file  Social Connections: Not on file  Intimate Partner Violence: Not on file    FAMILY HISTORY:  No family history on file.  CURRENT MEDICATIONS:  No outpatient encounter medications on file as of 01/07/2022.   No facility-administered encounter medications on file as of 01/07/2022.    ALLERGIES:  No Known Allergies   PHYSICAL EXAM:  ECOG PERFORMANCE STATUS: 0 - Asymptomatic  There were no vitals filed for this visit. There were no vitals filed for this visit. Physical Exam Constitutional:      Appearance: Normal appearance.  HENT:  Head: Normocephalic and atraumatic.     Mouth/Throat:     Mouth: Mucous membranes are moist.  Eyes:     Extraocular Movements: Extraocular movements intact.     Pupils: Pupils are equal, round, and reactive to light.  Cardiovascular:     Rate and Rhythm: Normal rate and regular rhythm.     Pulses: Normal pulses.     Heart sounds: Normal heart sounds.  Pulmonary:     Effort: Pulmonary effort is normal.     Breath sounds: Normal breath sounds.  Abdominal:     General: Bowel sounds are normal.     Palpations: Abdomen is  soft.     Tenderness: There is no abdominal tenderness.  Musculoskeletal:        General: No swelling.     Right lower leg: No edema.     Left lower leg: No edema.  Lymphadenopathy:     Cervical: No cervical adenopathy.  Skin:    General: Skin is warm and dry.  Neurological:     General: No focal deficit present.     Mental Status: He is alert and oriented to person, place, and time.  Psychiatric:        Mood and Affect: Mood normal.        Behavior: Behavior normal.      LABORATORY DATA:  I have reviewed the labs as listed.  CBC    Component Value Date/Time   WBC 5.5 12/13/2021 0910   RBC 5.04 12/13/2021 0910   HGB 15.8 12/13/2021 0910   HCT 46.4 12/13/2021 0910   PLT 135 (L) 12/13/2021 0910   MCV 92.1 12/13/2021 0910   MCH 31.3 12/13/2021 0910   MCHC 34.1 12/13/2021 0910   RDW 13.2 12/13/2021 0910   LYMPHSABS 1.9 12/13/2021 0910   MONOABS 0.4 12/13/2021 0910   EOSABS 0.3 12/13/2021 0910   BASOSABS 0.1 12/13/2021 0910      Latest Ref Rng & Units 07/07/2013   10:52 AM  CMP  Glucose 70 - 99 mg/dL 104   BUN 6 - 23 mg/dL 16   Creatinine 0.50 - 1.35 mg/dL 0.84   Sodium 137 - 147 mEq/L 140   Potassium 3.7 - 5.3 mEq/L 3.9   Chloride 96 - 112 mEq/L 105   CO2 19 - 32 mEq/L 25   Calcium 8.4 - 10.5 mg/dL 8.8     DIAGNOSTIC IMAGING:  I have independently reviewed the relevant imaging and discussed with the patient.  ASSESSMENT & PLAN: 1.  Thrombocytopenia - Seen at the request of Delman Cheadle, PA-C [facility]) for evaluation of thrombocytopenia. - Mild thrombocytopenia since at least 2012, but with limited prior laboratory data - Episode of severe thrombocytopenia and leukopenia during hospitalization in March/April 2015 for unspecified infectious system consisting of fever, splenomegaly, transaminitis, thrombocytopenia, leukopenia. CBC (07/08/2013): Platelets 32, WBC 1.7, ANC 0.9, ALC 0.5 Infectious work-up nondiagnostic: NEGATIVE for HIV,  hepatitis, EBV, CMV, parvovirus, fungal culture, tickborne illness CT abdomen/pelvis (07/13/2013): Massive splenomegaly measuring up to 20 cm but with normal-appearing liver (prior CT abdomen/pelvis in 2013 showed normal spleen) Bone marrow biopsy (07/14/2013) negative for any signs of malignancy Referred to outpatient hematology at discharge, but never followed up with them per available records. -No known  autoimmune/connective tissue disease, liver disease - No prior blood transfusions. - No recent infections. - No home medications or herbal supplements. - Drinks 3-4 beers each weekend. - Hematology work-up (12/13/2021): HIV screen was positive, but quantitative RNA was NEGATIVE. Negative  hepatitis B and hepatitis C.  Negative H. pylori. SPEP and immunofixation negative.  Free light chains normal.   ANA positive.  Rheumatoid factor negative. Normal copper, folate, MMA, B12. - Most recent CBC (12/22/2021): Platelets 135, otherwise normal CBC. -Abdominal ultrasound (12/23/2021): No acute process, splenomegaly from 2015 has resolved. - No abnormal bruising or petechial rash.  Occasional scant rectal bleeding from hemorrhoids. - No B symptoms or new lumps or bumps. -- No lymphadenopathy or hepatosplenomegaly noted on exam -- He reports intermittent knee pain and swelling, works on his knees as a Theme park manager.  Has received relief with steroid injections in the past.  No abnormal rashes. - PLAN: Differential diagnosis favors chronic mild ITP.  Repeat CBC with RTC in 6 months, we will also check immature platelet fraction at that time.    2.  Lung nodule - Incidental finding during hospitalization at [facility] in March/April 2015 - CT chest (07/13/2013): 4 mm nodule in left lower lobe of lung - Patient was referred for follow-up CT scan at [facility], but never received follow-up imaging per available records and patient report  - PLAN: After this length of time and with negative smoking history, no imaging follow-up  planned at this time per Dr. Delton Coombes   3.  Other history -PMH: Denies any chronic medical conditions or home medications. - SOCIAL: Patient was raised in [place], but has been in [place] since 2006.  He lives at home alone.  He works for a [employer].  He is a lifelong non-smoker.  Denies any illicit drug use.  Drinks approximately 3-4 beers each weekend. - FAMILY: Patient's paternal grandmother had brain cancer.  Maternal grandmother and grandfather both had unspecified cancer.   PLAN SUMMARY & DISPOSITION: Same-day labs and office visit in 6 months  All questions were answered. The patient knows to call the clinic with any problems, questions or concerns.  Medical decision making: Low  Time spent on visit: I spent 15 minutes counseling the patient face to face. The total time spent in the appointment was 22 minutes and more than 50% was on counseling.   Harriett Rush, PA-C  01/07/2022 8:44 AM

## 2022-01-07 ENCOUNTER — Inpatient Hospital Stay (HOSPITAL_BASED_OUTPATIENT_CLINIC_OR_DEPARTMENT_OTHER): Payer: PRIVATE HEALTH INSURANCE | Admitting: Physician Assistant

## 2022-01-07 VITALS — BP 125/85 | HR 76 | Temp 97.0°F | Resp 18 | Ht 67.0 in | Wt 185.0 lb

## 2022-01-07 DIAGNOSIS — D696 Thrombocytopenia, unspecified: Secondary | ICD-10-CM | POA: Diagnosis not present

## 2022-01-07 NOTE — Patient Instructions (Signed)
[facility] **   You were seen today by Tarri Abernethy PA-C for your low platelets (thrombocytopenia).    LOW PLATELETS As we discussed, platelets are small cells in your blood that help to form blood clots if you are bleeding.  This keeps you from bleeding too much. You have slightly fewer platelets in your body than normal. This is most likely from your immune system attacking your platelets by accident. You do not need any treatment for this condition right now, but we will continue to monitor your blood counts. Please seek immediate medical attention if you notice any severe bruising or abnormal bleeding such as nosebleeds, blood in the toilet, or vomiting blood.  FOLLOW-UP APPOINTMENT: Same-day labs and office visit in 6 months  ** Thank you for trusting me with your healthcare!  I strive to provide all of my patients with quality care at each visit.  If you receive a survey for this visit, I would be so grateful to you for taking the time to provide feedback.  Thank you in advance!  ~ Jaymason Ledesma                   Dr. Derek Jack   &   Tarri Abernethy, PA-C   - - - - - - - - - - - - - - - - - -    Thank you for choosing [facility] to provide your oncology and hematology care.  To afford each patient quality time with our provider, please arrive at least 15 minutes before your scheduled appointment time.   If you have a lab appointment with the [facility] please come in thru the Main Entrance and check in at the main information desk.  You need to re-schedule your appointment should you arrive 10 or more minutes late.  We strive to give you quality time with our providers, and arriving late affects you and other patients whose appointments are after yours.  Also, if you no show three or more times for appointments you may be dismissed from the clinic at the  providers discretion.     Again, thank you for choosing [facility].  Our hope is that these requests will decrease the amount of time that you wait before being seen by our physicians.       _____________________________________________________________  Should you have questions after your visit to [facility], please contact our office at [phone] and follow the prompts.  Our office hours are 8:00 a.m. and 4:30 p.m. Monday - Friday.  Please note that voicemails left after 4:00 p.m. may not be returned until the following business day.  We are closed weekends and major holidays.  You do have access to a nurse 24-7, just call the main number to the clinic [phone] and do not press any options, hold on the line and a nurse will answer the phone.    For prescription refill requests, have your pharmacy contact our office and allow 72 hours.

## 2022-01-09 ENCOUNTER — Ambulatory Visit: Payer: PRIVATE HEALTH INSURANCE | Admitting: Physician Assistant

## 2022-07-10 NOTE — Progress Notes (Deleted)
NO SHOW

## 2022-07-11 ENCOUNTER — Inpatient Hospital Stay: Payer: PRIVATE HEALTH INSURANCE

## 2022-07-11 ENCOUNTER — Inpatient Hospital Stay: Payer: PRIVATE HEALTH INSURANCE | Attending: Physician Assistant | Admitting: Physician Assistant
